# Patient Record
Sex: Male | Born: 1965 | Race: White | Hispanic: No | Marital: Single | Smoking: Current every day smoker
Health system: Southern US, Community
[De-identification: ages and names within clinical notes are randomized; demographics above are authoritative.]

## PROBLEM LIST (undated history)

## (undated) DIAGNOSIS — I10 Essential (primary) hypertension: Secondary | ICD-10-CM

## (undated) DIAGNOSIS — G629 Polyneuropathy, unspecified: Secondary | ICD-10-CM

## (undated) DIAGNOSIS — K56609 Unspecified intestinal obstruction, unspecified as to partial versus complete obstruction: Secondary | ICD-10-CM

## (undated) HISTORY — PX: HERNIA REPAIR: SHX51

## (undated) HISTORY — PX: ABDOMINAL SURGERY: SHX537

---

## 2015-02-14 ENCOUNTER — Emergency Department (HOSPITAL_BASED_OUTPATIENT_CLINIC_OR_DEPARTMENT_OTHER): Payer: Self-pay

## 2015-02-14 ENCOUNTER — Inpatient Hospital Stay (HOSPITAL_BASED_OUTPATIENT_CLINIC_OR_DEPARTMENT_OTHER)
Admission: EM | Admit: 2015-02-14 | Discharge: 2015-02-25 | DRG: 190 | Disposition: A | Discharge status: Home / Self Care | Payer: Self-pay | Attending: Family Medicine | Admitting: Family Medicine

## 2015-02-14 ENCOUNTER — Encounter (HOSPITAL_BASED_OUTPATIENT_CLINIC_OR_DEPARTMENT_OTHER): Payer: Self-pay | Admitting: Emergency Medicine

## 2015-02-14 ENCOUNTER — Emergency Department (HOSPITAL_BASED_OUTPATIENT_CLINIC_OR_DEPARTMENT_OTHER): Payer: MEDICAID

## 2015-02-14 DIAGNOSIS — D751 Secondary polycythemia: Secondary | ICD-10-CM | POA: Diagnosis present

## 2015-02-14 DIAGNOSIS — I420 Dilated cardiomyopathy: Secondary | ICD-10-CM | POA: Diagnosis present

## 2015-02-14 DIAGNOSIS — G629 Polyneuropathy, unspecified: Secondary | ICD-10-CM | POA: Diagnosis present

## 2015-02-14 DIAGNOSIS — J441 Chronic obstructive pulmonary disease with (acute) exacerbation: Principal | ICD-10-CM | POA: Diagnosis present

## 2015-02-14 DIAGNOSIS — J9621 Acute and chronic respiratory failure with hypoxia: Secondary | ICD-10-CM | POA: Diagnosis present

## 2015-02-14 DIAGNOSIS — J449 Chronic obstructive pulmonary disease, unspecified: Secondary | ICD-10-CM | POA: Diagnosis present

## 2015-02-14 DIAGNOSIS — Z79899 Other long term (current) drug therapy: Secondary | ICD-10-CM

## 2015-02-14 DIAGNOSIS — J984 Other disorders of lung: Secondary | ICD-10-CM | POA: Diagnosis present

## 2015-02-14 DIAGNOSIS — R109 Unspecified abdominal pain: Secondary | ICD-10-CM

## 2015-02-14 DIAGNOSIS — Z8249 Family history of ischemic heart disease and other diseases of the circulatory system: Secondary | ICD-10-CM

## 2015-02-14 DIAGNOSIS — Z825 Family history of asthma and other chronic lower respiratory diseases: Secondary | ICD-10-CM

## 2015-02-14 DIAGNOSIS — D7589 Other specified diseases of blood and blood-forming organs: Secondary | ICD-10-CM | POA: Diagnosis present

## 2015-02-14 DIAGNOSIS — J9690 Respiratory failure, unspecified, unspecified whether with hypoxia or hypercapnia: Secondary | ICD-10-CM

## 2015-02-14 DIAGNOSIS — J988 Other specified respiratory disorders: Secondary | ICD-10-CM | POA: Insufficient documentation

## 2015-02-14 DIAGNOSIS — F17200 Nicotine dependence, unspecified, uncomplicated: Secondary | ICD-10-CM | POA: Diagnosis present

## 2015-02-14 DIAGNOSIS — J9602 Acute respiratory failure with hypercapnia: Secondary | ICD-10-CM

## 2015-02-14 DIAGNOSIS — G4733 Obstructive sleep apnea (adult) (pediatric): Secondary | ICD-10-CM | POA: Diagnosis present

## 2015-02-14 DIAGNOSIS — J90 Pleural effusion, not elsewhere classified: Secondary | ICD-10-CM | POA: Diagnosis present

## 2015-02-14 DIAGNOSIS — Z720 Tobacco use: Secondary | ICD-10-CM | POA: Diagnosis present

## 2015-02-14 DIAGNOSIS — I10 Essential (primary) hypertension: Secondary | ICD-10-CM | POA: Diagnosis present

## 2015-02-14 DIAGNOSIS — J9600 Acute respiratory failure, unspecified whether with hypoxia or hypercapnia: Secondary | ICD-10-CM | POA: Diagnosis present

## 2015-02-14 DIAGNOSIS — D696 Thrombocytopenia, unspecified: Secondary | ICD-10-CM | POA: Diagnosis present

## 2015-02-14 DIAGNOSIS — J9622 Acute and chronic respiratory failure with hypercapnia: Secondary | ICD-10-CM | POA: Diagnosis present

## 2015-02-14 DIAGNOSIS — J189 Pneumonia, unspecified organism: Secondary | ICD-10-CM | POA: Diagnosis present

## 2015-02-14 DIAGNOSIS — R0902 Hypoxemia: Secondary | ICD-10-CM

## 2015-02-14 DIAGNOSIS — J9601 Acute respiratory failure with hypoxia: Secondary | ICD-10-CM | POA: Diagnosis present

## 2015-02-14 DIAGNOSIS — J9811 Atelectasis: Secondary | ICD-10-CM | POA: Diagnosis present

## 2015-02-14 DIAGNOSIS — E538 Deficiency of other specified B group vitamins: Secondary | ICD-10-CM | POA: Diagnosis present

## 2015-02-14 DIAGNOSIS — E669 Obesity, unspecified: Secondary | ICD-10-CM | POA: Diagnosis present

## 2015-02-14 DIAGNOSIS — J99 Respiratory disorders in diseases classified elsewhere: Secondary | ICD-10-CM | POA: Insufficient documentation

## 2015-02-14 DIAGNOSIS — J811 Chronic pulmonary edema: Secondary | ICD-10-CM

## 2015-02-14 DIAGNOSIS — Z6836 Body mass index (BMI) 36.0-36.9, adult: Secondary | ICD-10-CM

## 2015-02-14 HISTORY — DX: Polyneuropathy, unspecified: G62.9

## 2015-02-14 HISTORY — DX: Essential (primary) hypertension: I10

## 2015-02-14 HISTORY — DX: Unspecified intestinal obstruction, unspecified as to partial versus complete obstruction: K56.609

## 2015-02-14 LAB — LIPASE, BLOOD: LIPASE: 29 U/L (ref 11–51)

## 2015-02-14 LAB — CBC
HCT: 57.7 % — ABNORMAL HIGH (ref 39.0–52.0)
HEMOGLOBIN: 18 g/dL — AB (ref 13.0–17.0)
MCH: 31 pg (ref 26.0–34.0)
MCHC: 31.2 g/dL (ref 30.0–36.0)
MCV: 99.5 fL (ref 78.0–100.0)
Platelets: 136 10*3/uL — ABNORMAL LOW (ref 150–400)
RBC: 5.8 MIL/uL (ref 4.22–5.81)
RDW: 14.7 % (ref 11.5–15.5)
WBC: 5.5 10*3/uL (ref 4.0–10.5)

## 2015-02-14 LAB — COMPREHENSIVE METABOLIC PANEL
ALBUMIN: 3.4 g/dL — AB (ref 3.5–5.0)
ALK PHOS: 61 U/L (ref 38–126)
ALT: 32 U/L (ref 17–63)
ANION GAP: 5 (ref 5–15)
AST: 20 U/L (ref 15–41)
BILIRUBIN TOTAL: 0.7 mg/dL (ref 0.3–1.2)
BUN: 16 mg/dL (ref 6–20)
CALCIUM: 8.7 mg/dL — AB (ref 8.9–10.3)
CO2: 37 mmol/L — ABNORMAL HIGH (ref 22–32)
Chloride: 97 mmol/L — ABNORMAL LOW (ref 101–111)
Creatinine, Ser: 0.94 mg/dL (ref 0.61–1.24)
GFR calc Af Amer: >60 mL/min (ref >60)
GLUCOSE: 136 mg/dL — AB (ref 65–99)
POTASSIUM: 4.5 mmol/L (ref 3.5–5.1)
SODIUM: 139 mmol/L (ref 135–145)
TOTAL PROTEIN: 6.5 g/dL (ref 6.5–8.1)

## 2015-02-14 LAB — I-STAT ARTERIAL BLOOD GAS, ED
ACID-BASE EXCESS: 4 mmol/L — AB (ref 0.0–2.0)
BICARBONATE: 34 meq/L — AB (ref 20.0–24.0)
O2 Saturation: 85 %
PO2 ART: 58 mmHg — AB (ref 80.0–100.0)
Patient temperature: 98.6
TCO2: 36 mmol/L (ref 0–100)
pCO2 arterial: 70.7 mmHg (ref 35.0–45.0)
pH, Arterial: 7.29 — ABNORMAL LOW (ref 7.350–7.450)

## 2015-02-14 LAB — TROPONIN I

## 2015-02-14 LAB — D-DIMER, QUANTITATIVE: D-Dimer, Quant: 0.31 ug/mL-FEU (ref 0.00–0.50)

## 2015-02-14 LAB — BRAIN NATRIURETIC PEPTIDE: B Natriuretic Peptide: 28.9 pg/mL (ref 0.0–100.0)

## 2015-02-14 MED ORDER — CEFTRIAXONE SODIUM 1 G IJ SOLR
INTRAMUSCULAR | Status: AC
  Filled 2015-02-14: qty 10

## 2015-02-14 MED ORDER — IPRATROPIUM BROMIDE 0.02 % IN SOLN
0.5000 mg | Freq: Once | RESPIRATORY_TRACT | Status: AC
  Administered 2015-02-14: 0.5 mg via RESPIRATORY_TRACT
  Filled 2015-02-14: qty 2.5

## 2015-02-14 MED ORDER — IOHEXOL 350 MG/ML SOLN
100.0000 mL | Freq: Once | INTRAVENOUS | Status: AC | PRN
  Administered 2015-02-14: 100 mL via INTRAVENOUS

## 2015-02-14 MED ORDER — DEXTROSE 5 % IV SOLN
500.0000 mg | Freq: Once | INTRAVENOUS | Status: AC
  Administered 2015-02-14: 500 mg via INTRAVENOUS

## 2015-02-14 MED ORDER — LORAZEPAM 2 MG/ML IJ SOLN
1.0000 mg | Freq: Once | INTRAMUSCULAR | Status: AC
  Administered 2015-02-14: 1 mg via INTRAVENOUS
  Filled 2015-02-14: qty 1

## 2015-02-14 MED ORDER — AZITHROMYCIN 500 MG IV SOLR
INTRAVENOUS | Status: AC
  Filled 2015-02-14: qty 500

## 2015-02-14 MED ORDER — ALBUTEROL SULFATE (2.5 MG/3ML) 0.083% IN NEBU
5.0000 mg | INHALATION_SOLUTION | Freq: Once | RESPIRATORY_TRACT | Status: AC
  Administered 2015-02-14: 5 mg via RESPIRATORY_TRACT
  Filled 2015-02-14: qty 6

## 2015-02-14 MED ORDER — DEXTROSE 5 % IV SOLN
1.0000 g | Freq: Once | INTRAVENOUS | Status: AC
  Administered 2015-02-14: 1 g via INTRAVENOUS

## 2015-02-14 NOTE — ED Notes (Signed)
Patient resting.  Tolerating Bipap well

## 2015-02-14 NOTE — ED Notes (Signed)
MD at bedside. 

## 2015-02-14 NOTE — ED Notes (Signed)
Pt placed on BIPAP at 12/6 and 40% FI02 and full face mask. Pt tol well at this time.

## 2015-02-14 NOTE — ED Provider Notes (Signed)
CSN: [id number]     Arrival date & time 02/14/15  1738 History  By signing my name below, I, Murriel Hopper, attest that this documentation has been prepared under the direction and in the presence of Jerelyn Scott, MD. Electronically Signed: Murriel Hopper, ED Scribe. 02/14/2015. 6:03 PM.    Chief Complaint  Patient presents with  . Shortness of Breath   Patient is a 50 y.o. male presenting with shortness of breath. The history is provided by the patient. No language interpreter was used.  Shortness of Breath Severity:  Moderate Onset quality:  Gradual Duration:  3 days Timing:  Constant Progression:  Worsening Chronicity:  New Relieved by:  Rest Worsened by:  Activity, exertion and movement Ineffective treatments:  None tried Associated symptoms: no cough    HPI Comments: Bryan Gillespie is a 50 y.o. male who presents to the Emergency Department complaining of constant, worsening SOB that has been present for three days. Pt states that for the past three days he has felt some generalized weakness, and has had SOB, and states that today he woke up from a nap after not feeling well earlier in the day, and walked to his neighbors house. On the way to his neighbor's house he states he "felt like he was going to die" as he could barely walk and states he could barely breathe, so once he got back to his house he called EMS. Pt also reports he has had chronic intermittent swelling in his legs for two years, and reports that over the past couple of months he was diagnosed with bronchitis and the flu, and states he has not been feeling well in general over th past month. Pt states that his voice has changed recently as well, as he states it is deeper than it normally is. Pt also states he has been admitted twice to [facility].    Past Medical History  Diagnosis Date  . Hypertension   . Bowel obstruction (HCC)   . Peripheral neuropathy [facility])    Past Surgical History  Procedure  Laterality Date  . Abdominal surgery    . Hernia repair     Family History  Problem Relation Age of Onset  . Diabetes Mother   . Hypertension Other    Social History  Substance Use Topics  . Smoking status: Current Every Day Smoker  . Smokeless tobacco: None  . Alcohol Use: No    Review of Systems  Respiratory: Positive for shortness of breath. Negative for cough.     A complete 10 system review of systems was obtained and all systems are negative except as noted in the HPI and PMH.    Allergies  Review of patient's allergies indicates no known allergies.  Home Medications   Prior to Admission medications   Medication Sig Start Date End Date Taking? Authorizing Provider  amLODipine (NORVASC) 10 MG tablet Take 10 mg by mouth daily.   Yes Historical Provider, MD   BP 112/78 mmHg  Pulse 98  Temp(Src) 98.4 F (36.9 C) (Oral)  Resp 19  Ht 5\' 11"  (1.803 m)  Wt 120.1 kg  BMI 36.94 kg/m2  SpO2 93%  Vitals reviewed Physical Exam  Physical Examination: General appearance - alert, ill appearing, and in distress Mental status - alert, oriented to person, place, and time Eyes - no conjunctival injection no scleral icterus Mouth - mucous membranes moist, pharynx normal without lesions Chest - dimnished breath sounds bilaterally, BSS, no wheezing , no crackles,  no stridor or rhonchi Heart - normal rate, regular rhythm, normal S1, S2, no murmurs, rubs, clicks or gallops Abdomen - soft, nontender, nondistended, no masses or organomegaly Neurological - alert, oriented, normal speech, no focal findings or movement disorder noted Extremities - peripheral pulses normal, bilateral 2+ edema, no clubbing or cyanosis Skin - normal coloration and turgor, no rashes  ED Course  Procedures (including critical care time)  DIAGNOSTIC STUDIES: Oxygen Saturation is 92% on [place], low by my interpretation.    COORDINATION OF CARE: 6:01 PM Discussed treatment plan with pt at bedside and pt  agreed to plan.   Labs Review Labs Reviewed  CBC - Abnormal; Notable for the following:    Hemoglobin 18.0 (*)    HCT 57.7 (*)    Platelets 136 (*)    All other components within normal limits  COMPREHENSIVE METABOLIC PANEL - Abnormal; Notable for the following:    Chloride 97 (*)    CO2 37 (*)    Glucose, Bld 136 (*)    Calcium 8.7 (*)    Albumin 3.4 (*)    All other components within normal limits  URINE RAPID DRUG SCREEN, HOSP PERFORMED - Abnormal; Notable for the following:    Tetrahydrocannabinol POSITIVE (*)    All other components within normal limits  COMPREHENSIVE METABOLIC PANEL - Abnormal; Notable for the following:    Chloride 98 (*)    CO2 35 (*)    Glucose, Bld 117 (*)    Albumin 3.4 (*)    Total Bilirubin 1.3 (*)    All other components within normal limits  CBC - Abnormal; Notable for the following:    RBC 5.83 (*)    Hemoglobin 18.6 (*)    HCT 59.4 (*)    MCV 101.9 (*)    Platelets 135 (*)    All other components within normal limits  HEMOGLOBIN A1C - Abnormal; Notable for the following:    Hgb A1c MFr Bld 5.9 (*)    All other components within normal limits  LIPID PANEL - Abnormal; Notable for the following:    HDL 29 (*)    LDL Cholesterol 123 (*)    All other components within normal limits  BLOOD GAS, ARTERIAL - Abnormal; Notable for the following:    pH, Arterial 7.286 (*)    pCO2 arterial 78.5 (*)    pO2, Arterial 62.6 (*)    Bicarbonate 36.2 (*)    Acid-Base Excess 9.6 (*)    All other components within normal limits  BLOOD GAS, ARTERIAL - Abnormal; Notable for the following:    pH, Arterial 7.319 (*)    pCO2 arterial 70.3 (*)    pO2, Arterial 72.0 (*)    Bicarbonate 35.1 (*)    Acid-Base Excess 9.0 (*)    All other components within normal limits  BLOOD GAS, ARTERIAL - Abnormal; Notable for the following:    pCO2 arterial 59.3 (*)    pO2, Arterial 63.3 (*)    Bicarbonate 33.6 (*)    Acid-Base Excess 8.3 (*)    All other components  within normal limits  COMPREHENSIVE METABOLIC PANEL - Abnormal; Notable for the following:    Chloride 96 (*)    CO2 37 (*)    Total Protein 6.0 (*)    Albumin 3.1 (*)    All other components within normal limits  CBC WITH DIFFERENTIAL/PLATELET - Abnormal; Notable for the following:    WBC 11.8 (*)    Hemoglobin 17.9 (*)  HCT 56.8 (*)    MCV 101.8 (*)    Platelets 118 (*)    Neutro Abs 9.9 (*)    All other components within normal limits  I-STAT ARTERIAL BLOOD GAS, ED - Abnormal; Notable for the following:    pH, Arterial 7.290 (*)    pCO2 arterial 70.7 (*)    pO2, Arterial 58.0 (*)    Bicarbonate 34.0 (*)    Acid-Base Excess 4.0 (*)    All other components within normal limits  CULTURE, BLOOD (ROUTINE X 2)  CULTURE, BLOOD (ROUTINE X 2)  MRSA PCR SCREENING  CULTURE, EXPECTORATED SPUTUM-ASSESSMENT  GRAM STAIN  RESPIRATORY VIRUS PANEL  CULTURE, EXPECTORATED SPUTUM-ASSESSMENT  LIPASE, BLOOD  D-DIMER, QUANTITATIVE (NOT AT [facility])  TROPONIN I  BRAIN NATRIURETIC PEPTIDE  TROPONIN I  TROPONIN I  TROPONIN I  URINALYSIS, ROUTINE W REFLEX MICROSCOPIC (NOT AT [facility])  MAGNESIUM  PHOSPHORUS  TSH  INFLUENZA PANEL BY PCR (TYPE A & B, H1N1)  LEGIONELLA ANTIGEN, URINE  PROTIME-INR  STREP PNEUMONIAE URINARY ANTIGEN  MAGNESIUM   CRITICAL CARE Performed by: Ethelda Chick Total critical care time: 60 minutes Critical care time was exclusive of separately billable procedures and treating other patients. Critical care was necessary to treat or prevent imminent or life-threatening deterioration. Critical care was time spent personally by me on the following activities: development of treatment plan with patient and/or surrogate as well as nursing, discussions with consultants, evaluation of patient's response to treatment, examination of patient, obtaining history from patient or surrogate, ordering and performing treatments and interventions, ordering and review of laboratory studies,  ordering and review of radiographic studies, pulse oximetry and re-evaluation of patient's condition. Imaging Review Dg Chest Port 1 View  02/17/2015  CLINICAL DATA:  Respiratory failure EXAM: PORTABLE CHEST 1 VIEW COMPARISON:  02/14/2015 FINDINGS: Lungs are markedly under aerated. Bibasilar airspace disease has increased. No pneumothorax. Cardiac silhouette is prominent likely due to low volumes. IMPRESSION: Increased bibasilar airspace disease. Electronically Signed   By: Jolaine Click M.D.   On: 02/17/2015 07:45   I have personally reviewed and evaluated these images and lab results as part of my medical decision-making.   EKG Interpretation   Date/Time:  Monday February 14 2015 18:16:30 EST Ventricular Rate:  92 PR Interval:  167 QRS Duration: 104 QT Interval:  341 QTC Calculation: 422 R Axis:   -173 Text Interpretation:  Sinus rhythm Anterior infarct, old No old tracing to  compare Confirmed by [facility]  MD, Amirr Achord [phone]) on 02/14/2015 7:36:11 PM      MDM   Final diagnoses:  Abdominal pain  shortness of breath hypercapnea Respiratory failure  Pt presenting with c/o shortness of breath, leg swelling.  He states the leg swelling has been ongoing for 2 years- but today he had acute onset of shortness of breath.  He has not been feeling well with viral illnesses for the past couple of weeks, but SOB acutely worsened today.  CXR reassuring, labs reassuring but pt is hypoxic.  No wheezing on exam but BS diminished.  Pt is a smoker.  CT angio negative for PE- obtained despite negative d-dimer due to ongoing unexplained hypoxia and shortness of breath.  Pt did not improved after duonebs- presuming underlying COPD- ABG showed hypercapnia- pt started on bipap.  Admitted to stepdown- d/w hospitalist and with PCCM who feels he is stable for stepdown bed.     10:05 PM d/w Dr. Toniann Fail for admission to stepdown.  He requests d/w ICU- d/w Dr.  Manum PCCM- he states pt is ok for stepdown bed- will  admit to hospitalist service to step down bed.    I personally performed the services described in this documentation, which was scribed in my presence. The recorded information has been reviewed and is accurate.    Jerelyn Scott, MD 02/17/15 [phone]

## 2015-02-14 NOTE — Plan of Care (Signed)
50 year old male with no significant past medical history presented to the ER because of acute shortness of breath. As per Dr. Karma Ganja ED physician patient was hypoxic and ABG shows hypercarbia and had to be placed on BiPAP. CT angiogram was negative for PE. Patient was given nebulizer treatment despite with patient is still short of breath. Pulmonary critical care has been consulted and patient will be admitted to stepdown unit.  Bryan Gillespie.

## 2015-02-14 NOTE — ED Notes (Addendum)
Per ems: the patient reports that he has new onset quick swelling to his legs and lips with difficulty breathing. The patient has some abdominal discomfort because his Abdominal area is "tight" - LBM 1 hour prior to ems arrival. Low RA sats with EMS. Productive cough x 2 weeks

## 2015-02-15 ENCOUNTER — Inpatient Hospital Stay (HOSPITAL_COMMUNITY): Payer: Self-pay

## 2015-02-15 ENCOUNTER — Encounter (HOSPITAL_COMMUNITY): Payer: Self-pay | Admitting: Internal Medicine

## 2015-02-15 DIAGNOSIS — R0600 Dyspnea, unspecified: Secondary | ICD-10-CM

## 2015-02-15 DIAGNOSIS — Z720 Tobacco use: Secondary | ICD-10-CM | POA: Diagnosis present

## 2015-02-15 DIAGNOSIS — I10 Essential (primary) hypertension: Secondary | ICD-10-CM | POA: Diagnosis present

## 2015-02-15 DIAGNOSIS — J9602 Acute respiratory failure with hypercapnia: Secondary | ICD-10-CM

## 2015-02-15 DIAGNOSIS — J9600 Acute respiratory failure, unspecified whether with hypoxia or hypercapnia: Secondary | ICD-10-CM | POA: Diagnosis present

## 2015-02-15 DIAGNOSIS — J9601 Acute respiratory failure with hypoxia: Secondary | ICD-10-CM

## 2015-02-15 LAB — BLOOD GAS, ARTERIAL
Acid-Base Excess: 9 mmol/L — ABNORMAL HIGH (ref 0.0–2.0)
BICARBONATE: 35.1 meq/L — AB (ref 20.0–24.0)
DELIVERY SYSTEMS: POSITIVE
DRAWN BY: 331471
Expiratory PAP: 10
FIO2: 0.7
Inspiratory PAP: 15
MODE: POSITIVE
O2 Saturation: 92.6 %
Patient temperature: 98.6
TCO2: 37.3 mmol/L (ref 0–100)
pCO2 arterial: 70.3 mmHg (ref 35.0–45.0)
pH, Arterial: 7.319 — ABNORMAL LOW (ref 7.350–7.450)
pO2, Arterial: 72 mmHg — ABNORMAL LOW (ref 80.0–100.0)

## 2015-02-15 LAB — COMPREHENSIVE METABOLIC PANEL
ALBUMIN: 3.4 g/dL — AB (ref 3.5–5.0)
ALK PHOS: 71 U/L (ref 38–126)
ALT: 32 U/L (ref 17–63)
AST: 19 U/L (ref 15–41)
Anion gap: 6 (ref 5–15)
BUN: 9 mg/dL (ref 6–20)
CO2: 35 mmol/L — ABNORMAL HIGH (ref 22–32)
Calcium: 8.9 mg/dL (ref 8.9–10.3)
Chloride: 98 mmol/L — ABNORMAL LOW (ref 101–111)
Creatinine, Ser: 0.93 mg/dL (ref 0.61–1.24)
GFR calc Af Amer: >60 mL/min (ref >60)
GFR calc non Af Amer: >60 mL/min (ref >60)
GLUCOSE: 117 mg/dL — AB (ref 65–99)
POTASSIUM: 5.1 mmol/L (ref 3.5–5.1)
SODIUM: 139 mmol/L (ref 135–145)
TOTAL PROTEIN: 6.7 g/dL (ref 6.5–8.1)
Total Bilirubin: 1.3 mg/dL — ABNORMAL HIGH (ref 0.3–1.2)

## 2015-02-15 LAB — LIPID PANEL
CHOL/HDL RATIO: 5.7 ratio
CHOLESTEROL: 165 mg/dL (ref 0–200)
HDL: 29 mg/dL — ABNORMAL LOW (ref >40)
LDL Cholesterol: 123 mg/dL — ABNORMAL HIGH (ref 0–99)
Triglycerides: 64 mg/dL (ref <150)
VLDL: 13 mg/dL (ref 0–40)

## 2015-02-15 LAB — CBC
HEMATOCRIT: 59.4 % — AB (ref 39.0–52.0)
HEMOGLOBIN: 18.6 g/dL — AB (ref 13.0–17.0)
MCH: 31.9 pg (ref 26.0–34.0)
MCHC: 31.3 g/dL (ref 30.0–36.0)
MCV: 101.9 fL — ABNORMAL HIGH (ref 78.0–100.0)
Platelets: 135 10*3/uL — ABNORMAL LOW (ref 150–400)
RBC: 5.83 MIL/uL — AB (ref 4.22–5.81)
RDW: 15 % (ref 11.5–15.5)
WBC: 6.5 10*3/uL (ref 4.0–10.5)

## 2015-02-15 LAB — URINALYSIS, ROUTINE W REFLEX MICROSCOPIC
BILIRUBIN URINE: NEGATIVE
Glucose, UA: NEGATIVE mg/dL
Hgb urine dipstick: NEGATIVE
Ketones, ur: NEGATIVE mg/dL
LEUKOCYTES UA: NEGATIVE
NITRITE: NEGATIVE
PH: 5 (ref 5.0–8.0)
Protein, ur: NEGATIVE mg/dL
SPECIFIC GRAVITY, URINE: 1.015 (ref 1.005–1.030)

## 2015-02-15 LAB — TROPONIN I
Troponin I: <0.03 ng/mL (ref <0.031)
Troponin I: <0.03 ng/mL (ref <0.031)
Troponin I: <0.03 ng/mL (ref <0.031)

## 2015-02-15 LAB — RAPID URINE DRUG SCREEN, HOSP PERFORMED
AMPHETAMINES: NOT DETECTED
BARBITURATES: NOT DETECTED
BENZODIAZEPINES: NOT DETECTED
COCAINE: NOT DETECTED
OPIATES: NOT DETECTED
TETRAHYDROCANNABINOL: POSITIVE — AB

## 2015-02-15 LAB — MAGNESIUM: Magnesium: 2 mg/dL (ref 1.7–2.4)

## 2015-02-15 LAB — INFLUENZA PANEL BY PCR (TYPE A & B)
H1N1FLUPCR: NOT DETECTED
INFLBPCR: NEGATIVE
Influenza A By PCR: NEGATIVE

## 2015-02-15 LAB — MRSA PCR SCREENING: MRSA by PCR: NEGATIVE

## 2015-02-15 LAB — PROTIME-INR
INR: 1.18 (ref 0.00–1.49)
PROTHROMBIN TIME: 15.2 s (ref 11.6–15.2)

## 2015-02-15 LAB — STREP PNEUMONIAE URINARY ANTIGEN: Strep Pneumo Urinary Antigen: NEGATIVE

## 2015-02-15 LAB — TSH: TSH: 0.65 u[IU]/mL (ref 0.350–4.500)

## 2015-02-15 LAB — PHOSPHORUS: Phosphorus: 4.4 mg/dL (ref 2.5–4.6)

## 2015-02-15 MED ORDER — ACETAMINOPHEN 650 MG RE SUPP: 650.0000 mg | Freq: Four times a day (QID) | RECTAL | Status: DC | PRN

## 2015-02-15 MED ORDER — DEXTROSE 5 % IV SOLN
500.0000 mg | INTRAVENOUS | Status: DC
  Administered 2015-02-15: 500 mg via INTRAVENOUS
  Filled 2015-02-15 (×2): qty 500

## 2015-02-15 MED ORDER — MORPHINE SULFATE (PF) 2 MG/ML IV SOLN
2.0000 mg | INTRAVENOUS | Status: DC | PRN
  Administered 2015-02-15: 2 mg via INTRAVENOUS
  Filled 2015-02-15: qty 1

## 2015-02-15 MED ORDER — ACETAMINOPHEN 325 MG PO TABS
650.0000 mg | ORAL_TABLET | Freq: Four times a day (QID) | ORAL | Status: DC | PRN
  Administered 2015-02-19: 650 mg via ORAL
  Filled 2015-02-15: qty 2

## 2015-02-15 MED ORDER — LORAZEPAM 2 MG/ML IJ SOLN: 1.0000 mg | Freq: Once | INTRAMUSCULAR | Status: DC | PRN

## 2015-02-15 MED ORDER — ASPIRIN EC 81 MG PO TBEC
81.0000 mg | DELAYED_RELEASE_TABLET | Freq: Every day | ORAL | Status: DC
  Administered 2015-02-15 – 2015-02-25 (×11): 81 mg via ORAL
  Filled 2015-02-15 (×11): qty 1

## 2015-02-15 MED ORDER — ARFORMOTEROL TARTRATE 15 MCG/2ML IN NEBU
15.0000 ug | INHALATION_SOLUTION | Freq: Two times a day (BID) | RESPIRATORY_TRACT | Status: DC
  Administered 2015-02-16: 15 ug via RESPIRATORY_TRACT
  Filled 2015-02-15 (×6): qty 2

## 2015-02-15 MED ORDER — WHITE PETROLATUM GEL
Status: AC
  Filled 2015-02-15: qty 1

## 2015-02-15 MED ORDER — GUAIFENESIN ER 600 MG PO TB12
600.0000 mg | ORAL_TABLET | Freq: Two times a day (BID) | ORAL | Status: DC
  Administered 2015-02-15: 600 mg via ORAL
  Filled 2015-02-15: qty 1

## 2015-02-15 MED ORDER — METHYLPREDNISOLONE SODIUM SUCC 40 MG IJ SOLR
40.0000 mg | Freq: Four times a day (QID) | INTRAMUSCULAR | Status: DC
  Administered 2015-02-15 – 2015-02-16 (×6): 40 mg via INTRAVENOUS
  Filled 2015-02-15 (×6): qty 1

## 2015-02-15 MED ORDER — ENOXAPARIN SODIUM 40 MG/0.4ML ~~LOC~~ SOLN
40.0000 mg | Freq: Every day | SUBCUTANEOUS | Status: DC
Start: 2015-02-15 — End: 2015-02-25
  Administered 2015-02-15 – 2015-02-25 (×11): 40 mg via SUBCUTANEOUS
  Filled 2015-02-15 (×11): qty 0.4

## 2015-02-15 MED ORDER — IPRATROPIUM-ALBUTEROL 0.5-2.5 (3) MG/3ML IN SOLN
3.0000 mL | Freq: Four times a day (QID) | RESPIRATORY_TRACT | Status: DC
  Administered 2015-02-15 – 2015-02-16 (×5): 3 mL via RESPIRATORY_TRACT
  Filled 2015-02-15 (×5): qty 3

## 2015-02-15 MED ORDER — ENOXAPARIN SODIUM 40 MG/0.4ML ~~LOC~~ SOLN: 40.0000 mg | SUBCUTANEOUS | Status: DC

## 2015-02-15 MED ORDER — DM-GUAIFENESIN ER 30-600 MG PO TB12
1.0000 | ORAL_TABLET | Freq: Two times a day (BID) | ORAL | Status: DC
  Administered 2015-02-15 – 2015-02-23 (×17): 1 via ORAL
  Filled 2015-02-15 (×17): qty 1

## 2015-02-15 MED ORDER — DEXTROSE 5 % IV SOLN
1.0000 g | INTRAVENOUS | Status: DC
  Administered 2015-02-15 – 2015-02-17 (×3): 1 g via INTRAVENOUS
  Filled 2015-02-15 (×4): qty 10

## 2015-02-15 MED ORDER — IPRATROPIUM BROMIDE 0.02 % IN SOLN: 0.5000 mg | Freq: Four times a day (QID) | RESPIRATORY_TRACT | Status: DC

## 2015-02-15 MED ORDER — ONDANSETRON HCL 4 MG PO TABS: 4.0000 mg | ORAL_TABLET | Freq: Four times a day (QID) | ORAL | Status: DC | PRN

## 2015-02-15 MED ORDER — ONDANSETRON HCL 4 MG/2ML IJ SOLN: 4.0000 mg | Freq: Four times a day (QID) | INTRAMUSCULAR | Status: DC | PRN

## 2015-02-15 MED ORDER — SODIUM CHLORIDE 0.9 % IJ SOLN
3.0000 mL | Freq: Two times a day (BID) | INTRAMUSCULAR | Status: DC
  Administered 2015-02-15 – 2015-02-25 (×18): 3 mL via INTRAVENOUS

## 2015-02-15 MED ORDER — ALBUTEROL SULFATE (2.5 MG/3ML) 0.083% IN NEBU
5.0000 mg | INHALATION_SOLUTION | RESPIRATORY_TRACT | Status: DC | PRN
  Administered 2015-02-15: 5 mg via RESPIRATORY_TRACT
  Filled 2015-02-15: qty 6

## 2015-02-15 MED ORDER — NICOTINE 21 MG/24HR TD PT24
21.0000 mg | MEDICATED_PATCH | Freq: Every day | TRANSDERMAL | Status: DC
  Administered 2015-02-15 – 2015-02-25 (×11): 21 mg via TRANSDERMAL
  Filled 2015-02-15 (×11): qty 1

## 2015-02-15 NOTE — Progress Notes (Signed)
[facility] TEAM 1 - Stepdown/ICU TEAM Progress Note  Bryan Gillespie ZOX:[id number] DOB: December 28, 1965 DOA: 02/14/2015 PCP: Pcp Not In System  Admit HPI / Brief Narrative: Bryan Gillespie is a 49 y.o. WM PMHx Hypertension; Bowel obstruction (HCC); and Peripheral neuropathy (HCC), Tobacco Abuse.   Presented with worsening shortness of breath over the past 3 days associated generalized weakness He reports today he try to walk over to his neighbor's house and developed acute worsening shortness of breath he was" felt like he is going to die" so he returned and called EMS. He reports that he has presented similar symptoms to outpatient pruritus and was diagnosed with bronchitis and flu. Patient states that he had to be intubated 2 years ago. There is no records in the system regarding this  In emergency department was found to have ABG 7.290/CO2 70.7 and PO2 of 58.0 was transiently on BiPAP Troponin was normal hemoglobin noted to be elevated at 18 PCO2 of 37 CTA of the chest did not show any evidence of PE some evidence of atelectasis but less likely pneumonia  Was discussed with [facility] at this point felt the patient was stable for admission to stepdown  Hospitalist was called for admission for acute respiratory failure  HPI/Subjective: 1/24  Patient alert on BiPAP  Assessment/Plan: Acute respiratory failure with hypoxia and hypercarbia (HCC)/COPD exacerbation  -it is unclear if patient may have some CO2 retention chronically. He denies being on oxygen likely this is an acute change.  -Most likely multifactorial to include COPD Exacerbation /CAPS, CHF? -Continue empiric antibiotics -DuoNeb QID -Sodium Medrol 40 mg QID -Brovana BID -Morphine 2 mg PRN air hunger -Flutter valve when patient able to tolerate being off of BiPAP -Mucinex DM - Essential hypertension  -Currently BP soft hold all BP medication  -Given lower extremity edema and dyspnea will obtain echogram    Tobacco abuse  -  Continue Nicotine Patch. When patient closer to discharge a frank discussion needs to be had with patient, concerning he may die if he continues to smoke. Would also address changing patient to DO NOT RESUSCITATE at that time    Code Status: FULL Family Communication: no family present at time of exam Disposition Plan: SNF?    Consultants: Rudi Heap [facility]   Procedure/Significant Events: 1/23 CT angiogram PE protocol;- negative PE.-Bibasilar peribronchovascular linear opacities in dependent distribution, likely subsegmental atelectasis.Pneumonia although possible is felt less likely.   Culture 1/24 sputum culture pending 1/24 influenza panel pending 1/24 virus panel pending 1/24 Legionella urine antigen pending 1/24 strep pneumo urine antigen pending   Antibiotics: Azithromycin 1/24>> Ceftriaxone 1/24>>   DVT prophylaxis: Lovenox   Devices    LINES / TUBES:      Continuous Infusions:   Objective: VITAL SIGNS: Temp: 100.2 F (37.9 C) (01/24 0745) Temp Source: Axillary (01/24 0745) BP: 135/90 mmHg (01/24 0814) Pulse Rate: 87 (01/24 1100) SPO2; FIO2:   Intake/Output Summary (Last 24 hours) at 02/15/15 1130 Last data filed at 02/15/15 1100  Gross per 24 hour  Intake      3 ml  Output    900 ml  Net   -897 ml     Exam: General: Alert on BiPAP, positive Acute Respiratory Distress Eyes: Negative headache,negative scleral hemorrhage ENT: Negative Runny nose, negative gingival bleeding, Neck:  Negative scars, masses, torticollis, lymphadenopathy, JVD Lungs: diffuse poor air movement all lung fields, positive expiratory wheezes, negative crackles Cardiovascular: Tachycardic, Regular rhythm without murmur gallop or rub normal S1 and  S2 Abdomen:negative abdominal pain, nondistended, positive soft, bowel sounds, no rebound, no ascites, no appreciable mass Extremities: No significant cyanosis, clubbing, or edema bilateral lower  extremities Psychiatric:  Negative depression, negative anxiety, negative fatigue, negative mania  Neurologic:  Cranial nerves II through XII intact, tongue/uvula midline, all extremities muscle strength 5/5, sensation intact throughout,  negative dysarthria, negative expressive aphasia, negative receptive aphasia.   Data Reviewed: Basic Metabolic Panel:  Recent Labs Lab 02/14/15 1800 02/15/15 0902  NA 139 139  K 4.5 5.1  CL 97* 98*  CO2 37* 35*  GLUCOSE 136* 117*  BUN 16 9  CREATININE 0.94 0.93  CALCIUM 8.7* 8.9  MG  --  2.0  PHOS  --  4.4   Liver Function Tests:  Recent Labs Lab 02/14/15 1800 02/15/15 0902  AST 20 19  ALT 32 32  ALKPHOS 61 71  BILITOT 0.7 1.3*  PROT 6.5 6.7  ALBUMIN 3.4* 3.4*    Recent Labs Lab 02/14/15 1800  LIPASE 29   No results for input(s): AMMONIA in the last 168 hours. CBC:  Recent Labs Lab 02/14/15 1800 02/15/15 0902  WBC 5.5 6.5  HGB 18.0* 18.6*  HCT 57.7* 59.4*  MCV 99.5 101.9*  PLT 136* 135*   Cardiac Enzymes:  Recent Labs Lab 02/14/15 1800 02/15/15 0248 02/15/15 0902  TROPONINI <0.03 <0.03 <0.03   BNP (last 3 results)  Recent Labs  02/14/15 1800  BNP 28.9    ProBNP (last 3 results) No results for input(s): PROBNP in the last 8760 hours.  CBG: No results for input(s): GLUCAP in the last 168 hours.  No results found for this or any previous visit (from the past 240 hour(s)).   Studies:  Recent x-ray studies have been reviewed in detail by the Attending Physician  Scheduled Meds:  Scheduled Meds: . aspirin EC  81 mg Oral Daily  . azithromycin  500 mg Intravenous Q24H  . cefTRIAXone (ROCEPHIN)  IV  1 g Intravenous Q24H  . enoxaparin (LOVENOX) injection  40 mg Subcutaneous Daily  . guaiFENesin  600 mg Oral BID  . ipratropium-albuterol  3 mL Nebulization Q6H  . methylPREDNISolone (SOLU-MEDROL) injection  40 mg Intravenous 4 times per day  . nicotine  21 mg Transdermal Daily  . sodium chloride  3 mL  Intravenous Q12H  . white petrolatum        Time spent on care of this patient: 40 mins   Bryan Gillespie, Roselind Messier , MD  [facility] Office  [phone] Pager - [phone]  On-Call/Text Page:      [email or web address]      password [email or web address]  If 7PM-7AM, please contact night-coverage [email or web address] Password [email or web address] 02/15/2015, 11:30 AM   LOS: 1 day   Care during the described time interval was provided by me .  I have reviewed this patient's available data, including medical history, events of note, physical examination, and all test results as part of my evaluation. I have personally reviewed and interpreted all radiology studies.   Carolyne Littles, MD [phone] Pager

## 2015-02-15 NOTE — Progress Notes (Signed)
eLink Physician-Brief Progress Note Patient Name: Bryan Gillespie DOB: [birth year] MRN: [id number]   Date of Service  02/15/2015  HPI/Events of Note  Notified of need for DVT Prophylaxis. Hgb = 18.0, Platelets = 136K and Creatinine = 0.94.  eICU Interventions  Will order:  1. Lovenox 40 mg [place] now and Q day.     Intervention Category Intermediate Interventions: Best-practice therapies (e.g. DVT, beta blocker, etc.)  Shary Lamos [place] 02/15/2015, 1:42 AM

## 2015-02-15 NOTE — Progress Notes (Signed)
ABG results given to Dr. Joseph Art. Rt placed pt on 15LPM HFNC sats 88%. PT has very dim bs.Rt gave a PRN neb  of Albuterol. No distress noted at this time.

## 2015-02-15 NOTE — Progress Notes (Signed)
Rt gave pt his flutter valve . Pt knows and understands how to use.  

## 2015-02-15 NOTE — H&P (Signed)
PCP:  Pcp Not In System    Referring provider transfer from [facility]   Chief Complaint:   Shortness of breath  HPI: Bryan Gillespie is a 50 y.o. male   has a past medical history of Hypertension; Bowel obstruction (HCC); and Peripheral neuropathy (HCC).   Presented with worsening shortness of breath over the past 3 days associated generalized weakness He reports today he try to walk over to his neighbor's house and developed acute worsening shortness of breath he was" felt like he is going to die" so he returned and called EMS. He reports that he has presented similar symptoms to outpatient pruritus and was diagnosed with bronchitis and flu. Patient states that he had to be intubated 2 years ago. There is no records in the system regarding this  In emergency department was found to have ABG 7.290/CO2 70.7 and PO2 of 58.0 was transiently on BiPAP Troponin was normal hemoglobin noted to be elevated at 18 PCO2 of 37 CTA of the chest did not show any evidence of PE some evidence of atelectasis but less likely pneumonia  Was discussed with [facility] M at this point felt  the patient was stable for admission to stepdown  Hospitalist was called for admission for acute respiratory failure  Review of Systems:    Pertinent positives include: shortness of breath at rest. dyspnea on exertion  Constitutional:  No weight loss, night sweats, Fevers, chills, fatigue, weight loss  HEENT:  No headaches, Difficulty swallowing,Tooth/dental problems,Sore throat,  No sneezing, itching, ear ache, nasal congestion, post nasal drip,  Cardio-vascular:  No chest pain, Orthopnea, PND, anasarca, dizziness, palpitations.no Bilateral lower extremity swelling  GI:  No heartburn, indigestion, abdominal pain, nausea, vomiting, diarrhea, change in bowel habits, loss of appetite, melena, blood in stool, hematemesis Resp:   , No excess mucus, no productive cough, No non-productive cough, No coughing up of  blood.No change in color of mucus.No wheezing. Skin:  no rash or lesions. No jaundice GU:  no dysuria, change in color of urine, no urgency or frequency. No straining to urinate.  No flank pain.  Musculoskeletal:  No joint pain or no joint swelling. No decreased range of motion. No back pain.  Psych:  No change in mood or affect. No depression or anxiety. No memory loss.  Neuro: no localizing neurological complaints, no tingling, no weakness, no double vision, no gait abnormality, no slurred speech, no confusion  Otherwise ROS are negative except for above, 10 systems were reviewed  Past Medical History: Past Medical History  Diagnosis Date  . Hypertension   . Bowel obstruction (HCC)   . Peripheral neuropathy [facility])    Past Surgical History  Procedure Laterality Date  . Abdominal surgery    . Hernia repair       Medications: Prior to Admission medications   Medication Sig Start Date End Date Taking? Authorizing Provider  amLODipine (NORVASC) 10 MG tablet Take 10 mg by mouth daily.   Yes Historical Provider, MD    Allergies:  No Known Allergies  Social History:  Ambulatory  independently   Lives at home alone,         reports that he has been smoking.  He does not have any smokeless tobacco history on file. He reports that he does not drink alcohol or use illicit drugs.    Family History: family history includes Diabetes in his mother; Hypertension in his other.    Physical Exam: Patient Vitals for the past 24  hrs:  BP Temp Temp src Pulse Resp SpO2 Height Weight  02/15/15 0044 (!) 129/92 mmHg - - 96 - -  (1.803 m) 120.1 kg (264 lb 12.4 oz)  02/14/15 2300 (!) 134/106 mmHg - - 89 19 95 % - -  02/14/15 2230 128/95 mmHg - - 94 18 (!) 86 % - -  02/14/15 2200 124/89 mmHg - - 95 18 92 % - -  02/14/15 2148 - - - - - - - 117.935 kg (260 lb)  02/14/15 2100 (!) 142/107 mmHg - - 93 15 97 % - -  02/14/15 2057 119/88 mmHg - - 95 25 91 % - -  02/14/15 2036 119/88 mmHg  - - 94 20 95 % - -  02/14/15 2000 115/83 mmHg - - 92 20 94 % - -  02/14/15 1931 126/88 mmHg 98.7 F (37.1 C) Oral 93 20 90 % - -  02/14/15 1857 - - - - - 91 % - -  02/14/15 1822 133/100 mmHg - - 95 18 92 % - -  02/14/15 1745 123/81 mmHg 98.9 F (37.2 C) Oral 94 24 92 % - -    1. General:  in No Acute distress 2. Psychological: Alert and   Oriented 3. Head/ENT:    Dry Mucous Membranes                          Head Non traumatic, neck supple                            Poor Dentition 4. SKIN:   decreased Skin turgor,  Skin clean Dry and intact no rash 5. Heart: Regular rate and rhythm no Murmur, Rub or gallop 6. Lungs some  Mild crackles  BIPAP air sounds 7. Abdomen: Soft, non-tender, Non distended 8. Lower extremities: no clubbing, cyanosis, or edema 9. Neurologically Grossly intact, moving all 4 extremities equally 10. MSK: Normal range of motion  body mass index is 36.94 kg/(m^2).   Labs on Admission:   Results for orders placed or performed during the hospital encounter of 02/14/15 (from the past 24 hour(s))  CBC     Status: Abnormal   Collection Time: 02/14/15  6:00 PM  Result Value Ref Range   WBC 5.5 4.0 - 10.5 K/uL   RBC 5.80 4.22 - 5.81 MIL/uL   Hemoglobin 18.0 (H) 13.0 - 17.0 g/dL   HCT 16.1 (H) 09.6 - 04.5 %   MCV 99.5 78.0 - 100.0 fL   MCH 31.0 26.0 - 34.0 pg   MCHC 31.2 30.0 - 36.0 g/dL   RDW 40.9 81.1 - 91.4 %   Platelets 136 (L) 150 - 400 K/uL  Comprehensive metabolic panel     Status: Abnormal   Collection Time: 02/14/15  6:00 PM  Result Value Ref Range   Sodium 139 135 - 145 mmol/L   Potassium 4.5 3.5 - 5.1 mmol/L   Chloride 97 (L) 101 - 111 mmol/L   CO2 37 (H) 22 - 32 mmol/L   Glucose, Bld 136 (H) 65 - 99 mg/dL   BUN 16 6 - 20 mg/dL   Creatinine, Ser 7.82 0.61 - 1.24 mg/dL   Calcium 8.7 (L) 8.9 - 10.3 mg/dL   Total Protein 6.5 6.5 - 8.1 g/dL   Albumin 3.4 (L) 3.5 - 5.0 g/dL   AST 20 15 - 41 U/L   ALT 32 17 - 63  U/L   Alkaline Phosphatase 61 38 -  126 U/L   Total Bilirubin 0.7 0.3 - 1.2 mg/dL   GFR calc non Af Amer >60 >60 mL/min   GFR calc Af Amer >60 >60 mL/min   Anion gap 5 5 - 15  Lipase, blood     Status: None   Collection Time: 02/14/15  6:00 PM  Result Value Ref Range   Lipase 29 11 - 51 U/L  D-dimer, quantitative (not at [facility])     Status: None   Collection Time: 02/14/15  6:00 PM  Result Value Ref Range   D-Dimer, Quant 0.31 0.00 - 0.50 ug/mL-FEU  Troponin I     Status: None   Collection Time: 02/14/15  6:00 PM  Result Value Ref Range   Troponin I <0.03 <0.031 ng/mL  Brain natriuretic peptide     Status: None   Collection Time: 02/14/15  6:00 PM  Result Value Ref Range   B Natriuretic Peptide 28.9 0.0 - 100.0 pg/mL  I-Stat Arterial Blood Gas, ED - (order at [facility] and [facility] only)     Status: Abnormal   Collection Time: 02/14/15  7:59 PM  Result Value Ref Range   pH, Arterial 7.290 (L) 7.350 - 7.450   pCO2 arterial 70.7 (HH) 35.0 - 45.0 mmHg   pO2, Arterial 58.0 (L) 80.0 - 100.0 mmHg   Bicarbonate 34.0 (H) 20.0 - 24.0 mEq/L   TCO2 36 0 - 100 mmol/L   O2 Saturation 85.0 %   Acid-Base Excess 4.0 (H) 0.0 - 2.0 mmol/L   Patient temperature 98.6 F    Collection site RADIAL, ALLEN'S TEST ACCEPTABLE    Drawn by Operator    Sample type ARTERIAL    Comment NOTIFIED PHYSICIAN     UA ordered  No results found for: HGBA1C  Estimated Creatinine Clearance: 125.3 mL/min (by C-G formula based on Cr of 0.94).  BNP (last 3 results) No results for input(s): PROBNP in the last 8760 hours.  Other results:  I have pearsonaly reviewed this: ECG REPORT  Rate: 92  Rhythm: S rhythm ST&T Change: no Ischemic changes QTC 422  Filed Weights   02/14/15 2148 02/15/15 0044  Weight: 117.935 kg (260 lb) 120.1 kg (264 lb 12.4 oz)     Cultures: No results found for: SDES, SPECREQUEST, CULT, REPTSTATUS   Radiological Exams on Admission: Dg Chest 2 View  02/14/2015  CLINICAL DATA:  Shortness of breath and weakness for 3 days.  Initial encounter. EXAM: CHEST  2 VIEW COMPARISON:  None. FINDINGS: There is no consolidative process, pneumothorax or effusion. Heart size is normal. Calcific density projecting over the right lower lung zone seen on the frontal view only and may be pleural-based. The IMPRESSION: No acute disease. Electronically Signed   By: Drusilla Kanner M.D.   On: 02/14/2015 19:02   Ct Angio Chest Pe W/cm &/or Wo Cm  02/14/2015  CLINICAL DATA:  Shortness of breath and cough for 2 weeks. History of COPD. EXAM: CT ANGIOGRAPHY CHEST WITH CONTRAST TECHNIQUE: Multidetector CT imaging of the chest was performed using the standard protocol during bolus administration of intravenous contrast. Multiplanar CT image reconstructions and MIPs were obtained to evaluate the vascular anatomy. CONTRAST:  OMNIPAQUE IOHEXOL 350 MG/ML SOLN COMPARISON:  Chest radiograph 02/14/2015 FINDINGS: Mediastinum/Lymph Nodes: No pulmonary emboli or thoracic aortic dissection identified. No masses or pathologically enlarged lymph nodes identified. The heart is normal in size. There is mild calcified atherosclerotic disease. No evidence of pericardial effusion.  Lungs/Pleura: No pulmonary mass or effusion. There are bibasilar peribronchovascular linear opacities in dependent distribution, likely representing atelectasis. No evidence of mucous plugging in the main, lobar and segmental bronchi. Upper abdomen: No acute findings. Musculoskeletal: No chest wall mass or suspicious bone lesions identified. Review of the MIP images confirms the above findings. IMPRESSION: No evidence of pulmonary embolus or thoracic dissection. Mild calcific atherosclerotic disease of the coronary arteries. Bibasilar peribronchovascular linear opacities in dependent distribution, likely representing subsegmental atelectasis. Airspace consolidation due to pneumonia although possible is felt less likely. Electronically Signed   By: Ted Mcalpine M.D.   On: 02/14/2015 20:48    Dg Abd 2 Views  02/14/2015  CLINICAL DATA:  Shortness of breath and weakness with tightness and abdomen. EXAM: ABDOMEN - 2 VIEW COMPARISON:  None. FINDINGS: Upright film shows no evidence for intraperitoneal free air. There is no evidence for gaseous bowel dilation to suggest obstruction. No unexpected abdominal pelvic calcification. Curvilinear density overlying the dome of the right hemidiaphragm is presumably atelectasis in the right lower lobe. IMPRESSION: No evidence for bowel perforation or obstruction. Electronically Signed   By: Kennith Center M.D.   On: 02/14/2015 19:04    Chart has been reviewed  Family not at  Bedside   Assessment/Plan  50 year old gentleman with past history of hypertension presents with acute respiratory failure hypoxia and hypercarbia patient has history of tobacco abuse may have history of COPD  Present on Admission:  . Acute respiratory failure with hypoxia and hypercarbia (HCC) -it is unclear if patient may have some CO2 retention chronically. He denies being on oxygen likely this is an acute change. Suspected COPD exacerbation admit to stepdown continue BiPAP be CCM consult has been called  Suspected COPD exacerbation we'll continue with antibiotic coverage. When necessary albuterol scheduled Atrovent and steroids will benefit from PFTs as an outpatient  . Essential hypertension -given lower extremity swelling to hold off on Norvasc at this point blood pressure stable continue to monitor obtain echogram and can make a decision regarding long-term management  Given lower extremity edema and dyspnea will obtain echogram to evaluate for any right heart failure  . Tobacco abuse - Regarding Importance of Quitting, Ordered Nicotine Patch and Tobacco Cessation Protocol     Prophylaxis: Lovenox   CODE STATUS:  FULL CODE   as per patient    Disposition:   To home once workup is complete and patient is stable  Other plan as per orders.  I have spent a total of  67 min on this admission  Anola Mcgough 02/15/2015, 3:37 AM   [facility]  Pager [phone]   after 2 AM please page floor coverage PA If 7AM-7PM, please contact the day team taking care of the patient  [email or web address]  Password [email or web address]

## 2015-02-15 NOTE — Progress Notes (Signed)
Pt currently on BIPAP at this time no distress noted.

## 2015-02-15 NOTE — Progress Notes (Signed)
Rt tried to place pt on 55%vm pt sats went to 85%. Rt placed back on BIPAP.

## 2015-02-15 NOTE — Progress Notes (Signed)
Pt has critical CO2 of 78.5. Midlevel informed. Advised to call critical care team.

## 2015-02-15 NOTE — Progress Notes (Signed)
  Echocardiogram 2D Echocardiogram has been performed.  Arvil Chaco 02/15/2015, 11:58 AM

## 2015-02-15 NOTE — Consult Note (Signed)
Name: Bryan Gillespie MRN: [id number] DOB: 11/10/1965    ADMISSION DATE:  02/14/2015 CONSULTATION DATE:  1/24  REFERRING MD :  Dr. Adela Glimpse  CHIEF COMPLAINT:  SOB  SIGNIFICANT EVENTS  1/23 admit  STUDIES:  1/23 CT angio chest > No evidence of pulmonary embolus or thoracic dissection. Mild calcific atherosclerotic disease of the coronary arteries. Bibasilar peribronchovascular linear opacities in dependent distribution, likely representing subsegmental atelectasis. Airspace consolidation due to pneumonia although possible is felt less likely.   HISTORY OF PRESENT ILLNESS:  50 year old male with PMH as below, which includes HTN, bowel obstruction, and neuropathy. He presented to ED with c/o progressive SOB x 3 days. 1/23 when attempting to ambulate to neighbors house he became profoundly dyspneic, and felt as though "he was going to die". He called EMS. In the ED he reported recent diagnoses of flu and bronchitis. He was found to have ABG 7.290/CO2 70.7 and PO2 of 58.0. He was started on BiPAP. He underwent CTA of the chest with concern for PE, scan was only positive for some ATX vs less likely PNA. He was admitted to SDU under hospitalist for COPD exacerbation, however, he does not carry a formal diagnosis of this. He is a long time and current every day smoker. Marland Kitchen  PAST MEDICAL HISTORY :   has a past medical history of Hypertension; Bowel obstruction (HCC); and Peripheral neuropathy (HCC).  has past surgical history that includes Abdominal surgery and Hernia repair. Prior to Admission medications   Medication Sig Start Date End Date Taking? Authorizing Provider  amLODipine (NORVASC) 10 MG tablet Take 10 mg by mouth daily.   Yes Historical Provider, MD   No Known Allergies  FAMILY HISTORY:  family history includes Diabetes in his mother; Hypertension in his other. SOCIAL HISTORY:  reports that he has been smoking.  He does not have any smokeless tobacco history on file. He reports that he  does not drink alcohol or use illicit drugs.  REVIEW OF SYSTEMS:   Limited due to AMS and BiPAP  SUBJECTIVE:   VITAL SIGNS: Temp:  [97.8 F (36.6 C)-98.9 F (37.2 C)] 97.8 F (36.6 C) (01/24 0328) Pulse Rate:  [89-96] 93 (01/24 0416) Resp:  [15-25] 19 (01/24 0416) BP: (115-142)/(81-107) 129/92 mmHg (01/24 0044) SpO2:  [86 %-97 %] 91 % (01/24 0416) FiO2 (%):  [50 %] 50 % (01/23 2230) Weight:  [117.935 kg (260 lb)-120.1 kg (264 lb 12.4 oz)] 120.1 kg (264 lb 12.4 oz) (01/24 0044)  PHYSICAL EXAMINATION: General:  Obese male on BiPAP Neuro:  Somnolent, easily arousable HEENT:  [place]/AT, PERRL, no JVD noted Cardiovascular:  RRR, no MRG Lungs:  Poor air entry throughout, no obvious wheeze Abdomen:  Obese, soft, non-tender, non-distended Musculoskeletal: No acute deformity or ROM limitation. Skin:  Grossly intact   Recent Labs Lab 02/14/15 1800  NA 139  K 4.5  CL 97*  CO2 37*  BUN 16  CREATININE 0.94  GLUCOSE 136*    Recent Labs Lab 02/14/15 1800  HGB 18.0*  HCT 57.7*  WBC 5.5  PLT 136*   Dg Chest 2 View  02/14/2015  CLINICAL DATA:  Shortness of breath and weakness for 3 days. Initial encounter. EXAM: CHEST  2 VIEW COMPARISON:  None. FINDINGS: There is no consolidative process, pneumothorax or effusion. Heart size is normal. Calcific density projecting over the right lower lung zone seen on the frontal view only and may be pleural-based. The IMPRESSION: No acute disease. Electronically Signed   By:  Drusilla Kanner M.D.   On: 02/14/2015 19:02   Ct Angio Chest Pe W/cm &/or Wo Cm  02/14/2015  CLINICAL DATA:  Shortness of breath and cough for 2 weeks. History of COPD. EXAM: CT ANGIOGRAPHY CHEST WITH CONTRAST TECHNIQUE: Multidetector CT imaging of the chest was performed using the standard protocol during bolus administration of intravenous contrast. Multiplanar CT image reconstructions and MIPs were obtained to evaluate the vascular anatomy. CONTRAST:  OMNIPAQUE IOHEXOL  350 MG/ML SOLN COMPARISON:  Chest radiograph 02/14/2015 FINDINGS: Mediastinum/Lymph Nodes: No pulmonary emboli or thoracic aortic dissection identified. No masses or pathologically enlarged lymph nodes identified. The heart is normal in size. There is mild calcified atherosclerotic disease. No evidence of pericardial effusion. Lungs/Pleura: No pulmonary mass or effusion. There are bibasilar peribronchovascular linear opacities in dependent distribution, likely representing atelectasis. No evidence of mucous plugging in the main, lobar and segmental bronchi. Upper abdomen: No acute findings. Musculoskeletal: No chest wall mass or suspicious bone lesions identified. Review of the MIP images confirms the above findings. IMPRESSION: No evidence of pulmonary embolus or thoracic dissection. Mild calcific atherosclerotic disease of the coronary arteries. Bibasilar peribronchovascular linear opacities in dependent distribution, likely representing subsegmental atelectasis. Airspace consolidation due to pneumonia although possible is felt less likely. Electronically Signed   By: Ted Mcalpine M.D.   On: 02/14/2015 20:48   Dg Abd 2 Views  02/14/2015  CLINICAL DATA:  Shortness of breath and weakness with tightness and abdomen. EXAM: ABDOMEN - 2 VIEW COMPARISON:  None. FINDINGS: Upright film shows no evidence for intraperitoneal free air. There is no evidence for gaseous bowel dilation to suggest obstruction. No unexpected abdominal pelvic calcification. Curvilinear density overlying the dome of the right hemidiaphragm is presumably atelectasis in the right lower lobe. IMPRESSION: No evidence for bowel perforation or obstruction. Electronically Signed   By: Kennith Center M.D.   On: 02/14/2015 19:04    ASSESSMENT / PLAN:  Acute on chronic hypoxemic/hypercarbic respiratory failure  COPD with acute exacerbation ?CAP Probable OSA  P: -Continuous BiPAP -Repeat ABG this AM -Scheduled and PRN bronchodilators -IV  steroids, taper as able -Continue CAP coverage (ceftriaxone and azithromycin) per primary team -Limit sedating meds -Will need pulmonary follow up and PFTs  Joneen Roach, AGACNP-BC [facility] Pulmonology/Critical Care Pager [phone] or [phone]  02/15/2015 4:36 AM   Attending Note:  I have examined patient, reviewed labs, studies and notes. I have discussed the case with Henreitta Leber, and I agree with the data and plans as amended above. Pt with acute (probably on chronic) combined resp failure. Suspect precipitating factor was AE-COPD in setting URI, now with possible su[perimposed PNA. Contributors are obesity, OSA. On my eval he is comfortable but hypoxemic on BiPAP 12/6. I will increase to 15/10, probably increase Fio2. Continue BiPAP continuous for now. If unable to get him off BiPAp by this pm then likely move to ICU. Independent critical care time is 35 minutes.   Levy Pupa, MD, PhD 02/15/2015, 7:48 AM [facility] [phone] or if no answer [phone]

## 2015-02-15 NOTE — Progress Notes (Signed)
E-link informed of pt's  CO2 of 78.5. Will continue to monitor.

## 2015-02-16 DIAGNOSIS — R0902 Hypoxemia: Secondary | ICD-10-CM | POA: Diagnosis present

## 2015-02-16 DIAGNOSIS — J189 Pneumonia, unspecified organism: Secondary | ICD-10-CM

## 2015-02-16 DIAGNOSIS — Z720 Tobacco use: Secondary | ICD-10-CM

## 2015-02-16 LAB — BLOOD GAS, ARTERIAL
ACID-BASE EXCESS: 9.6 mmol/L — AB (ref 0.0–2.0)
Acid-Base Excess: 8.3 mmol/L — ABNORMAL HIGH (ref 0.0–2.0)
BICARBONATE: 36.2 meq/L — AB (ref 20.0–24.0)
Bicarbonate: 33.6 mEq/L — ABNORMAL HIGH (ref 20.0–24.0)
DRAWN BY: 41875
Drawn by: 274071
Expiratory PAP: 6
Inspiratory PAP: 12
Mode: POSITIVE
O2 CONTENT: 15 L/min
O2 SAT: 89.2 %
O2 Saturation: 89.9 %
PATIENT TEMPERATURE: 98.6
PCO2 ART: 78.5 mmHg — AB (ref 35.0–45.0)
PCO2 ART: 59.3 mmHg — AB (ref 35.0–45.0)
PH ART: 7.286 — AB (ref 7.350–7.450)
PH ART: 7.372 (ref 7.350–7.450)
PO2 ART: 62.6 mmHg — AB (ref 80.0–100.0)
Patient temperature: 98.6
RATE: 7 resp/min
TCO2: 38.6 mmol/L (ref 0–100)
TCO2: 35.4 mmol/L (ref 0–100)
pO2, Arterial: 63.3 mmHg — ABNORMAL LOW (ref 80.0–100.0)

## 2015-02-16 LAB — LEGIONELLA ANTIGEN, URINE

## 2015-02-16 LAB — HEMOGLOBIN A1C
HEMOGLOBIN A1C: 5.9 % — AB (ref 4.8–5.6)
MEAN PLASMA GLUCOSE: 123 mg/dL

## 2015-02-16 MED ORDER — IPRATROPIUM-ALBUTEROL 0.5-2.5 (3) MG/3ML IN SOLN
3.0000 mL | Freq: Four times a day (QID) | RESPIRATORY_TRACT | Status: DC
  Administered 2015-02-16 – 2015-02-18 (×6): 3 mL via RESPIRATORY_TRACT
  Filled 2015-02-16 (×6): qty 3

## 2015-02-16 MED ORDER — AZITHROMYCIN 500 MG PO TABS
500.0000 mg | ORAL_TABLET | Freq: Every day | ORAL | Status: DC
  Administered 2015-02-16 – 2015-02-17 (×2): 500 mg via ORAL
  Filled 2015-02-16 (×4): qty 1

## 2015-02-16 MED ORDER — PREDNISONE 20 MG PO TABS
40.0000 mg | ORAL_TABLET | Freq: Every day | ORAL | Status: DC
  Administered 2015-02-17 – 2015-02-21 (×5): 40 mg via ORAL
  Filled 2015-02-16 (×5): qty 2

## 2015-02-16 NOTE — Progress Notes (Signed)
[facility] TEAM 1 - Stepdown/ICU TEAM PROGRESS NOTE  Bryan Gillespie HYQ:[id number] DOB: [birth year] DOA: 02/14/2015 PCP: Pcp Not In System  Admit HPI / Brief Narrative: 49 y.o.M Hx Hypertension; Bowel obstruction; Peripheral neuropathy, and Tobacco Abuse who presented with worsening shortness of breath over 3 days associated generalized weakness.  Patient stated he had to be intubated 2 years ago for similar sx.   In emergency department was found to have ABG 7.290/CO2 70.7 and PO2 of 58.0.  Troponin was normal - hemoglobin elevated at 18 - PCO2 of 37 - CTA of the chest did not show any evidence of PE.  HPI/Subjective: The patient is resting comfortably in bed.  He is currently not in acute distress but is requiring oxygen and his work of breathing is somewhat increased.  He is able to complete full sentences.  He states he feels much better.  He denies chest pain nausea vomiting fevers chills or abdominal pain.  He admits to smoking one to 2 packs a day and states he began smoking as a teenager.  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 - continue aggressive medical therapy - consider outpatient evaluation for alpha-1 antitrypsin deficiency given report a family history of "many people with very severe emphysema at a young age" in his family - may very well require home oxygen - will definitely need to be educated on use of inhalers and nebulizers  Essential hypertension  Blood pressure currently very well controlled  Tobacco abuse I have advised the patient that he must absolutely discontinue smoking completely and permanently to include marijuana - I have explained to him that if he continues to smoke he will die and that it will likely occur within the next year - I have also advised him that he should avoid any exposure to secondhand  smoke  Polycythemia Due to above - follow  Code Status: FULL Family Communication: Spoke with patient's mother at bedside Disposition Plan: SDU  Consultants: PCCM  Procedures: TTE - 1/24 - EF 60-65 percent - grade 1 diastolic dysfunction - no significant valvular abnormalities - mild/moderately dilated RV  Antibiotics: Azithromycin 1/24 > Ceftriaxone 1/24 >  DVT prophylaxis: lovenox   Objective: Blood pressure 117/89, pulse 100, temperature 98.1 F (36.7 C), temperature source Oral, resp. rate 20, height  (1.803 m), weight 120.1 kg (264 lb 12.4 oz), SpO2 92 %.  Intake/Output Summary (Last 24 hours) at 02/16/15 1107 Last data filed at 02/16/15 0924  Gross per 24 hour  Intake    660 ml  Output   3150 ml  Net  -2490 ml   Exam: General: Alert and conversant in mild to moderate respiratory difficulty Lungs: Very poor air movement throughout all fields - no active wheeze or focal crackles appreciable Cardiovascular: Very distant heart sounds - RRR - no appreciable murmur Abdomen: Nontender, nondistended, soft, bowel sounds positive, no rebound, no ascites, no appreciable mass Extremities: No significant cyanosis, or clubbing;  Trace edema bilateral lower extremities  Data Reviewed:  Basic Metabolic Panel:  Recent Labs Lab 02/14/15 1800 02/15/15 0902  NA 139 139  K 4.5 5.1  CL 97* 98*  CO2 37* 35*  GLUCOSE 136* 117*  BUN 16 9  CREATININE 0.94 0.93  CALCIUM 8.7* 8.9  MG  --  2.0  PHOS  --  4.4    CBC:  Recent  Labs Lab 02/14/15 1800 02/15/15 0902  WBC 5.5 6.5  HGB 18.0* 18.6*  HCT 57.7* 59.4*  MCV 99.5 101.9*  PLT 136* 135*    Liver Function Tests:  Recent Labs Lab 02/14/15 1800 02/15/15 0902  AST 20 19  ALT 32 32  ALKPHOS 61 71  BILITOT 0.7 1.3*  PROT 6.5 6.7  ALBUMIN 3.4* 3.4*    Recent Labs Lab 02/14/15 1800  LIPASE 29   Coags:  Recent Labs Lab 02/15/15 0902  INR 1.18   Cardiac Enzymes:  Recent Labs Lab  02/14/15 1800 02/15/15 0248 02/15/15 0902 02/15/15 1508  TROPONINI <0.03 <0.03 <0.03 <0.03    Recent Results (from the past 240 hour(s))  MRSA PCR Screening     Status: None   Collection Time: 02/15/15 10:58 AM  Result Value Ref Range Status   MRSA by PCR NEGATIVE NEGATIVE Final    Comment:        The GeneXpert MRSA Assay (FDA approved for NASAL specimens only), is one component of a comprehensive MRSA colonization surveillance program. It is not intended to diagnose MRSA infection nor to guide or monitor treatment for MRSA infections.      Studies:   Recent x-ray studies have been reviewed in detail by the Attending Physician  Scheduled Meds:  Scheduled Meds: . arformoterol  15 mcg Nebulization BID  . aspirin EC  81 mg Oral Daily  . azithromycin  500 mg Intravenous Q24H  . cefTRIAXone (ROCEPHIN)  IV  1 g Intravenous Q24H  . dextromethorphan-guaiFENesin  1 tablet Oral BID  . enoxaparin (LOVENOX) injection  40 mg Subcutaneous Daily  . ipratropium-albuterol  3 mL Nebulization Q6H  . methylPREDNISolone (SOLU-MEDROL) injection  40 mg Intravenous 4 times per day  . nicotine  21 mg Transdermal Daily  . sodium chloride  3 mL Intravenous Q12H    Time spent on care of this patient: 35 mins   Faiza Bansal T , MD   [facility] Office  [phone] Pager - Text Page per Loretha Stapler as per below:  On-Call/Text Page:      [email or web address]      password [email or web address]  If 7PM-7AM, please contact night-coverage [email or web address] Password [email or web address] 02/16/2015, 11:07 AM   LOS: 2 days

## 2015-02-16 NOTE — Progress Notes (Signed)
Informed Minor NP of critical value pCO2 59.3. No new orders at this time.

## 2015-02-16 NOTE — Progress Notes (Signed)
CRITICAL VALUE ALERT  Critical value received:  PCO2 59.3  Date of notification:  02/16/15  Time of notification:  1225  Critical value read back:Yes.    Nurse who received alert:  Nysha Koplin  MD notified (1st page):  Chrissie Noa Minor NP  Time of first page:  1230  MD notified (2nd page):  Time of second page:  Responding MD:  Hart Rochester NP  Time MD responded:  [phone]

## 2015-02-16 NOTE — Care Management Note (Addendum)
Case Management Note  Patient Details  Name: Fergus Throne MRN: [id number] Date of Birth: [birth year]  Subjective/Objective:   Date:02/16/15 Spoke with patient at the bedside. Introduced self as Sports coach and explained role in discharge planning and how to be reached. Verified patient lives in town, with mother, he is independent. Expressed potential need for home oxygen and nebulizer machine, will have to check with [facility] for charity.  Verified patient anticipates to go home with mother at time of discharge and will have part-time supervision by mother at this time to best of their knowledge. Patient confirmed needing help with their medication, he has no insurance, [facility] [address]  Patient is driven by mother to MD appointments.  Verified patient has PCP [facility] Blade [facility]   Most likely may need home oxygen, [facility] will check to see if patient qualifies for charity ,since has no insurance.  Plan: CM will continue to follow for discharge planning and [facility] resources.                  Action/Plan:   Expected Discharge Date:                  Expected Discharge Plan:  Home/Self Care  In-House Referral:     Discharge planning Services  CM Consult  Post Acute Care Choice:    Choice offered to:     DME Arranged:    DME Agency:     HH Arranged:    HH Agency:     Status of Service:  In process, will continue to follow  Medicare Important Message Given:    Date Medicare IM Given:    Medicare IM give by:    Date Additional Medicare IM Given:    Additional Medicare Important Message give by:     If discussed at Long Length of Stay Meetings, dates discussed:    Additional Comments:  Leone Haven, RN 02/16/2015, 5:28 PM

## 2015-02-16 NOTE — Progress Notes (Signed)
Name: Bryan Gillespie MRN: [id number] DOB: [birth year]    ADMISSION DATE:  02/14/2015 CONSULTATION DATE:  1/24  REFERRING MD :  Dr. Adela Glimpse  CHIEF COMPLAINT:  SOB  SIGNIFICANT EVENTS  1/23 admit  STUDIES:  1/23 CT angio chest > No evidence of pulmonary embolus or thoracic dissection. Mild calcific atherosclerotic disease of the coronary arteries. Bibasilar peribronchovascular linear opacities in dependent distribution, likely representing subsegmental atelectasis. Airspace consolidation due to pneumonia although possible is felt less likely.   HISTORY OF PRESENT ILLNESS:  50 year old male with PMH as below, which includes HTN, bowel obstruction, and neuropathy. He presented to ED with c/o progressive SOB x 3 days. 1/23 when attempting to ambulate to neighbors house he became profoundly dyspneic, and felt as though "he was going to die". He called EMS. In the ED he reported recent diagnoses of flu and bronchitis. He was found to have ABG 7.290/CO2 70.7 and PO2 of 58.0. He was started on BiPAP. He underwent CTA of the chest with concern for PE, scan was only positive for some ATX vs less likely PNA. He was admitted to SDU under hospitalist for COPD exacerbation, however, he does not carry a formal diagnosis of this. He is a long time and current every day smoker. .    SUBJECTIVE:   VITAL SIGNS: Temp:  [98.1 F (36.7 C)-98.8 F (37.1 C)] 98.1 F (36.7 C) (01/25 0757) Pulse Rate:  [86-111] 100 (01/25 0411) Resp:  [17-22] 20 (01/25 0411) BP: (106-135)/(71-89) 117/89 mmHg (01/25 0411) SpO2:  [88 %-93 %] 92 % (01/25 0804) FiO2 (%):  [55 %] 55 % (01/24 2316)  PHYSICAL EXAMINATION: General:  Obese male refuses bipap on 15 l [place] with sats 89% Neuro:  Awake and follows commands. Refuses bipap HEENT:  /AT, PERRL, no JVD noted Cardiovascular:  RRR, no MRG Lungs:  Poor air entry throughout, no obvious wheeze Abdomen:  Obese, soft, non-tender, non-distended Musculoskeletal: No acute deformity  or ROM limitation. Skin:  Grossly intact   Recent Labs Lab 02/14/15 1800 02/15/15 0902  NA 139 139  K 4.5 5.1  CL 97* 98*  CO2 37* 35*  BUN 16 9  CREATININE 0.94 0.93  GLUCOSE 136* 117*    Recent Labs Lab 02/14/15 1800 02/15/15 0902  HGB 18.0* 18.6*  HCT 57.7* 59.4*  WBC 5.5 6.5  PLT 136* 135*   Dg Chest 2 View  02/14/2015  CLINICAL DATA:  Shortness of breath and weakness for 3 days. Initial encounter. EXAM: CHEST  2 VIEW COMPARISON:  None. FINDINGS: There is no consolidative process, pneumothorax or effusion. Heart size is normal. Calcific density projecting over the right lower lung zone seen on the frontal view only and may be pleural-based. The IMPRESSION: No acute disease. Electronically Signed   By: Drusilla Kanner M.D.   On: 02/14/2015 19:02   Ct Angio Chest Pe W/cm &/or Wo Cm  02/14/2015  CLINICAL DATA:  Shortness of breath and cough for 2 weeks. History of COPD. EXAM: CT ANGIOGRAPHY CHEST WITH CONTRAST TECHNIQUE: Multidetector CT imaging of the chest was performed using the standard protocol during bolus administration of intravenous contrast. Multiplanar CT image reconstructions and MIPs were obtained to evaluate the vascular anatomy. CONTRAST:  OMNIPAQUE IOHEXOL 350 MG/ML SOLN COMPARISON:  Chest radiograph 02/14/2015 FINDINGS: Mediastinum/Lymph Nodes: No pulmonary emboli or thoracic aortic dissection identified. No masses or pathologically enlarged lymph nodes identified. The heart is normal in size. There is mild calcified atherosclerotic disease. No evidence of pericardial  effusion. Lungs/Pleura: No pulmonary mass or effusion. There are bibasilar peribronchovascular linear opacities in dependent distribution, likely representing atelectasis. No evidence of mucous plugging in the main, lobar and segmental bronchi. Upper abdomen: No acute findings. Musculoskeletal: No chest wall mass or suspicious bone lesions identified. Review of the MIP images confirms the above  findings. IMPRESSION: No evidence of pulmonary embolus or thoracic dissection. Mild calcific atherosclerotic disease of the coronary arteries. Bibasilar peribronchovascular linear opacities in dependent distribution, likely representing subsegmental atelectasis. Airspace consolidation due to pneumonia although possible is felt less likely. Electronically Signed   By: Ted Mcalpine M.D.   On: 02/14/2015 20:48   Dg Abd 2 Views  02/14/2015  CLINICAL DATA:  Shortness of breath and weakness with tightness and abdomen. EXAM: ABDOMEN - 2 VIEW COMPARISON:  None. FINDINGS: Upright film shows no evidence for intraperitoneal free air. There is no evidence for gaseous bowel dilation to suggest obstruction. No unexpected abdominal pelvic calcification. Curvilinear density overlying the dome of the right hemidiaphragm is presumably atelectasis in the right lower lobe. IMPRESSION: No evidence for bowel perforation or obstruction. Electronically Signed   By: Kennith Center M.D.   On: 02/14/2015 19:04    ASSESSMENT / PLAN:  Acute on chronic hypoxemic/hypercarbic respiratory failure , ABG with baseline pCO2 ~ 60 COPD with acute exacerbation, improved ?CAP Probable OSA (refuses bipap 1/25)  P: -would benefit from CPAP or BiPAp qhs > he has refused today 1/25 -Scheduled and PRN bronchodilators: change duoneb to qid, stop brovana (redundant) -IV steroids > convert to Pred PO and taper over about 10 days.  -Continue CAP coverage (ceftriaxone and azithromycin) per primary team -Limit sedating meds, dc'd MSO4 -Will need pulmonary follow up and full PFTs, likely a PSG as well if he will allow - bubble study to r/o shunt    Brett Canales Minor ACNP [facility] Pager [phone] till 3 pm If no answer page [phone] 02/16/2015, 9:48 AM   Attending Note:  I have examined patient, reviewed labs, studies and notes. I have discussed the case with S Minor, and I agree with the data and plans as amended above. Off BiPAP  and tolerating. Hypoxemia a bit better but still significant. Likely needs CPAP qhs. Would complete therapy for AE-COPD and CAP, convert meds to PO. Wean O2 as able. Given degree of hypoxemia, consider A-V shunt > bubble study.   Levy Pupa, MD, PhD 02/16/2015, 3:25 PM [facility] [phone] or if no answer [phone]

## 2015-02-17 ENCOUNTER — Inpatient Hospital Stay (HOSPITAL_COMMUNITY): Payer: Self-pay

## 2015-02-17 ENCOUNTER — Inpatient Hospital Stay (HOSPITAL_COMMUNITY): Payer: MEDICAID

## 2015-02-17 LAB — COMPREHENSIVE METABOLIC PANEL
ALBUMIN: 3.1 g/dL — AB (ref 3.5–5.0)
ALT: 29 U/L (ref 17–63)
AST: 18 U/L (ref 15–41)
Alkaline Phosphatase: 55 U/L (ref 38–126)
Anion gap: 10 (ref 5–15)
BUN: 15 mg/dL (ref 6–20)
CHLORIDE: 96 mmol/L — AB (ref 101–111)
CO2: 37 mmol/L — ABNORMAL HIGH (ref 22–32)
Calcium: 8.9 mg/dL (ref 8.9–10.3)
Creatinine, Ser: 0.92 mg/dL (ref 0.61–1.24)
GFR calc Af Amer: >60 mL/min (ref >60)
GFR calc non Af Amer: >60 mL/min (ref >60)
GLUCOSE: 96 mg/dL (ref 65–99)
POTASSIUM: 4.7 mmol/L (ref 3.5–5.1)
Sodium: 143 mmol/L (ref 135–145)
Total Bilirubin: 0.7 mg/dL (ref 0.3–1.2)
Total Protein: 6 g/dL — ABNORMAL LOW (ref 6.5–8.1)

## 2015-02-17 LAB — CBC WITH DIFFERENTIAL/PLATELET
Basophils Absolute: 0 10*3/uL (ref 0.0–0.1)
Basophils Relative: 0 %
EOS PCT: 0 %
Eosinophils Absolute: 0 10*3/uL (ref 0.0–0.7)
HCT: 56.8 % — ABNORMAL HIGH (ref 39.0–52.0)
Hemoglobin: 17.9 g/dL — ABNORMAL HIGH (ref 13.0–17.0)
LYMPHS ABS: 1.3 10*3/uL (ref 0.7–4.0)
LYMPHS PCT: 11 %
MCH: 32.1 pg (ref 26.0–34.0)
MCHC: 31.5 g/dL (ref 30.0–36.0)
MCV: 101.8 fL — AB (ref 78.0–100.0)
MONO ABS: 0.6 10*3/uL (ref 0.1–1.0)
MONOS PCT: 5 %
Neutro Abs: 9.9 10*3/uL — ABNORMAL HIGH (ref 1.7–7.7)
Neutrophils Relative %: 84 %
PLATELETS: 118 10*3/uL — AB (ref 150–400)
RBC: 5.58 MIL/uL (ref 4.22–5.81)
RDW: 14.5 % (ref 11.5–15.5)
WBC: 11.8 10*3/uL — ABNORMAL HIGH (ref 4.0–10.5)

## 2015-02-17 LAB — MAGNESIUM: Magnesium: 2.3 mg/dL (ref 1.7–2.4)

## 2015-02-17 MED ORDER — NAPHAZOLINE-GLYCERIN 0.012-0.2 % OP SOLN
1.0000 [drp] | Freq: Four times a day (QID) | OPHTHALMIC | Status: DC | PRN
  Filled 2015-02-17: qty 15

## 2015-02-17 MED ORDER — NAPHAZOLINE-PHENIRAMINE 0.025-0.3 % OP SOLN
1.0000 [drp] | Freq: Four times a day (QID) | OPHTHALMIC | Status: DC | PRN
  Administered 2015-02-17 – 2015-02-24 (×2): 1 [drp] via OPHTHALMIC
  Filled 2015-02-17 (×3): qty 5

## 2015-02-17 NOTE — Progress Notes (Signed)
            TRANSCRANIAL DOPPLER BUBBLE STUDY   Mr. Bryan Gillespie Date of Birth:  Mar 17, 1965 Medical Record Number:  [id number]  Referring MD  : Dr Delton Coombes Indications: Diagnostic Date of Procedure: 02/17/2015 Clinical History: Evaluate for right to left shunt  Technical Description:   Transcranial Doppler Bubble Study was performed at the bedside after taking verbal informed consent from the patient and explaining risk/benefits. The right middle cerebral artery was insonated using a hand held probe by the technician. And IV line had been previously inserted in the left forearm by the RN using aseptic precautions. Agitated saline injection at rest and after valsalva maneuver  Did not result in high intensity transient signals (HITS).   Impression:  Negative Transcranial Doppler Bubble Study  not indicative of right to left intracardiac or pulmonary shunt.   Results were explained to the patient. Questions were answered.     Delia Heady, MD Medical Director [facility] Pager: [phone] 02/17/2015 3:38 PM

## 2015-02-17 NOTE — Progress Notes (Signed)
[place] TEAM 1 - Stepdown/ICU TEAM Progress Note  Bryan Gillespie ZOX:[id number] DOB: 1965-09-19 DOA: 02/14/2015 PCP: Pcp Not In System  Admit HPI / Brief Narrative: Bryan Gillespie is a 50 y.o. WM PMHx Hypertension; Bowel obstruction (HCC); and Peripheral neuropathy (HCC), Tobacco Abuse.   Presented with worsening shortness of breath over the past 3 days associated generalized weakness He reports today he try to walk over to his neighbor's house and developed acute worsening shortness of breath he was" felt like he is going to die" so he returned and called EMS. He reports that he has presented similar symptoms to outpatient pruritus and was diagnosed with bronchitis and flu. Patient states that he had to be intubated 2 years ago. There is no records in the system regarding this  In emergency department was found to have ABG 7.290/CO2 70.7 and PO2 of 58.0 was transiently on BiPAP Troponin was normal hemoglobin noted to be elevated at 18 PCO2 of 37 CTA of the chest did not show any evidence of PE some evidence of atelectasis but less likely pneumonia  Was discussed with [facility] M at this point felt the patient was stable for admission to stepdown  Hospitalist was called for admission for acute respiratory failure  HPI/Subjective: 1/26  A/O 4, NAD.   Assessment/Plan: Acute respiratory failure with hypoxia and hypercarbia (HCC)/COPD exacerbation  -it is unclear if patient may have some CO2 retention chronically. He denies being on oxygen likely this is an acute change.  -Most likely multifactorial to include COPD Exacerbation /CAPS, CHF? -Continue empiric antibiotics -DuoNeb QID -Sodium Medrol 40 mg Daily -Flutter valve when patient able to tolerate being off of BiPAP -Mucinex DM - Essential hypertension  -Currently BP soft hold all BP medication  -Given lower extremity edema and dyspnea will obtain echogram    Tobacco abuse  - Continue Nicotine Patch. -1/26 counseled patient on  seriousness of his illness, explained that if he continued to smoke this would result in his death. Patient stated he no longer was going to smoke. Continue: Code Status as Full     Code Status: FULL Family Communication: no family present at time of exam Disposition Plan: SNF?    Consultants: Rudi Heap [facility] M   Procedure/Significant Events: 1/23 CT angiogram PE protocol;- negative PE.-Bibasilar peribronchovascular linear opacities in dependent distribution, likely subsegmental atelectasis.Pneumonia although possible is felt less likely.   Culture 1/24 sputum culture not obtained 1/24 blood right arm/hand NGTD 1/24 MRSA by PCR negative 1/24 influenza A/B/H1N1 negative 1/24 virus panel pending 1/24 Legionella/strep pneumo urine antigen negative    Antibiotics: Azithromycin 1/24>> Ceftriaxone 1/24>>   DVT prophylaxis: Lovenox   Devices    LINES / TUBES:      Continuous Infusions:   Objective: VITAL SIGNS: Temp: 97.8 F (36.6 C) (01/26 1631) Temp Source: Oral (01/26 1631) BP: 121/89 mmHg (01/26 1631) Pulse Rate: 86 (01/26 1631) SPO2; FIO2:   Intake/Output Summary (Last 24 hours) at 02/17/15 2027 Last data filed at 02/17/15 1900  Gross per 24 hour  Intake   1290 ml  Output   2400 ml  Net  -1110 ml     Exam: General: A/O 4, NAD, positive Acute Respiratory Distress (on high flow nasal cannula) Eyes: Negative headache,negative scleral hemorrhage ENT: Negative Runny nose, negative gingival bleeding, Neck:  Negative scars, masses, torticollis, lymphadenopathy, JVD Lungs: diffuse poor air movement all lung fields (significantly improved), negative expiratory wheezes, negative crackles Cardiovascular: Regular rhythm and rate without murmur gallop or rub  normal S1 and S2 Abdomen:negative abdominal pain, nondistended, positive soft, bowel sounds, no rebound, no ascites, no appreciable mass Extremities: No significant cyanosis, clubbing, or edema  bilateral lower extremities Psychiatric:  Negative depression, negative anxiety, negative fatigue, negative mania  Neurologic:  Cranial nerves II through XII intact, tongue/uvula midline, all extremities muscle strength 5/5, sensation intact throughout,  negative dysarthria, negative expressive aphasia, negative receptive aphasia.   Data Reviewed: Basic Metabolic Panel:  Recent Labs Lab 02/14/15 1800 02/15/15 0902 02/17/15 0830  NA 139 139 143  K 4.5 5.1 4.7  CL 97* 98* 96*  CO2 37* 35* 37*  GLUCOSE 136* 117* 96  BUN CREATININE 0.94 0.93 0.92  CALCIUM 8.7* 8.9 8.9  MG  --  2.0 2.3  PHOS  --  4.4  --    Liver Function Tests:  Recent Labs Lab 02/14/15 1800 02/15/15 0902 02/17/15 0830  AST ALT 32 32 29  ALKPHOS 61 71 55  BILITOT 0.7 1.3* 0.7  PROT 6.5 6.7 6.0*  ALBUMIN 3.4* 3.4* 3.1*    Recent Labs Lab 02/14/15 1800  LIPASE 29   No results for input(s): AMMONIA in the last 168 hours. CBC:  Recent Labs Lab 02/14/15 1800 02/15/15 0902 02/17/15 0830  WBC 5.5 6.5 11.8*  NEUTROABS  --   --  9.9*  HGB 18.0* 18.6* 17.9*  HCT 57.7* 59.4* 56.8*  MCV 99.5 101.9* 101.8*  PLT 136* 135* 118*   Cardiac Enzymes:  Recent Labs Lab 02/14/15 1800 02/15/15 0248 02/15/15 0902 02/15/15 1508  TROPONINI <0.03 <0.03 <0.03 <0.03   BNP (last 3 results)  Recent Labs  02/14/15 1800  BNP 28.9    ProBNP (last 3 results) No results for input(s): PROBNP in the last 8760 hours.  CBG: No results for input(s): GLUCAP in the last 168 hours.  Recent Results (from the past 240 hour(s))  Culture, blood (routine x 2) Call MD if unable to obtain prior to antibiotics being given     Status: None (Preliminary result)   Collection Time: 02/15/15  3:38 AM  Result Value Ref Range Status   Specimen Description BLOOD RIGHT ARM  Final   Special Requests AEROBIC BOTTLE ONLY  Final   Culture NO GROWTH 2 DAYS  Final   Report Status PENDING  Incomplete    Culture, blood (routine x 2) Call MD if unable to obtain prior to antibiotics being given     Status: None (Preliminary result)   Collection Time: 02/15/15  4:08 AM  Result Value Ref Range Status   Specimen Description BLOOD RIGHT HAND  Final   Special Requests IN PEDIATRIC BOTTLE  Final   Culture NO GROWTH 2 DAYS  Final   Report Status PENDING  Incomplete  MRSA PCR Screening     Status: None   Collection Time: 02/15/15 10:58 AM  Result Value Ref Range Status   MRSA by PCR NEGATIVE NEGATIVE Final    Comment:        The GeneXpert MRSA Assay (FDA approved for NASAL specimens only), is one component of a comprehensive MRSA colonization surveillance program. It is not intended to diagnose MRSA infection nor to guide or monitor treatment for MRSA infections.      Studies:  Recent x-ray studies have been reviewed in detail by the Attending Physician  Scheduled Meds:  Scheduled Meds: . aspirin EC  81 mg Oral Daily  . azithromycin  500 mg Oral QHS  .  cefTRIAXone (ROCEPHIN)  IV  1 g Intravenous Q24H  . dextromethorphan-guaiFENesin  1 tablet Oral BID  . enoxaparin (LOVENOX) injection  40 mg Subcutaneous Daily  . ipratropium-albuterol  3 mL Nebulization QID  . nicotine  21 mg Transdermal Daily  . predniSONE  40 mg Oral Q breakfast  . sodium chloride  3 mL Intravenous Q12H    Time spent on care of this patient: 40 mins   Tracee Mccreery, Roselind Messier , MD  [facility] Office  [phone] Pager - [phone]  On-Call/Text Page:      [email or web address]      password [email or web address]  If 7PM-7AM, please contact night-coverage [email or web address] Password [email or web address] 02/17/2015, 8:27 PM   LOS: 3 days   Care during the described time interval was provided by me .  I have reviewed this patient's available data, including medical history, events of note, physical examination, and all test results as part of my evaluation. I have personally reviewed and interpreted all radiology studies.   Carolyne Littles,  MD [phone] Pager

## 2015-02-17 NOTE — Progress Notes (Signed)
Name: Bryan Gillespie MRN: [id number] DOB: 03-Oct-1965    ADMISSION DATE:  02/14/2015 CONSULTATION DATE:  1/24  REFERRING MD :  Dr. Adela Glimpse  CHIEF COMPLAINT:  SOB  SIGNIFICANT EVENTS  1/23 admit  STUDIES:  1/23 CT angio chest > No evidence of pulmonary embolus or thoracic dissection. Mild calcific atherosclerotic disease of the coronary arteries. Bibasilar peribronchovascular linear opacities in dependent distribution, likely representing subsegmental atelectasis. Airspace consolidation due to pneumonia although possible is felt less likely.   HISTORY OF PRESENT ILLNESS:  50 year old male with PMH as below, which includes HTN, bowel obstruction, and neuropathy. He presented to ED with c/o progressive SOB x 3 days. 1/23 when attempting to ambulate to neighbors house he became profoundly dyspneic, and felt as though "he was going to die". He called EMS. In the ED he reported recent diagnoses of flu and bronchitis. He was found to have ABG 7.290/CO2 70.7 and PO2 of 58.0. He was started on BiPAP. He underwent CTA of the chest with concern for PE, scan was only positive for some ATX vs less likely PNA. He was admitted to SDU under hospitalist for COPD exacerbation, however, he does not carry a formal diagnosis of this. He is a long time and current every day smoker. .    SUBJECTIVE:   VITAL SIGNS: Temp:  [97.8 F (36.6 C)-98.6 F (37 C)] 97.8 F (36.6 C) (01/26 0811) Pulse Rate:  [78-111] 78 (01/26 0402) Resp:  [13-19] 16 (01/26 0402) BP: (113-126)/(78-95) 114/83 mmHg (01/26 0402) SpO2:  [89 %-94 %] 94 % (01/26 0718)  PHYSICAL EXAMINATION: General:  Obese male refuses bipap on 15 l [place] with sats 89% Neuro:  Awake and follows commands. Refuses bipap HEENT:  [place]/AT, PERRL, no JVD noted Cardiovascular:  RRR, no MRG Lungs:  Poor air entry throughout, no obvious wheeze Abdomen:  Obese, soft, non-tender, non-distended Musculoskeletal: No acute deformity or ROM limitation. Skin:  Grossly  intact   Recent Labs Lab 02/14/15 1800 02/15/15 0902  NA 139 139  K 4.5 5.1  CL 97* 98*  CO2 37* 35*  BUN 16 9  CREATININE 0.94 0.93  GLUCOSE 136* 117*    Recent Labs Lab 02/14/15 1800 02/15/15 0902 02/17/15 0830  HGB 18.0* 18.6* 17.9*  HCT 57.7* 59.4* 56.8*  WBC 5.5 6.5 11.8*  PLT 136* 135* PENDING   Dg Chest Port 1 View  02/17/2015  CLINICAL DATA:  Respiratory failure EXAM: PORTABLE CHEST 1 VIEW COMPARISON:  02/14/2015 FINDINGS: Lungs are markedly under aerated. Bibasilar airspace disease has increased. No pneumothorax. Cardiac silhouette is prominent likely due to low volumes. IMPRESSION: Increased bibasilar airspace disease. Electronically Signed   By: Jolaine Click M.D.   On: 02/17/2015 07:45    ASSESSMENT / PLAN:  Acute on chronic hypoxemic/hypercarbic respiratory failure , ABG with baseline pCO2 ~ 60 COPD with acute exacerbation, improved ?CAP Probable OSA (refuses bipap 1/25)  P: -would benefit from CPAP or BiPAp qhs > he has been refusing -Scheduled and PRN bronchodilators: change duoneb to qid, stop brovana (redundant). Would recommend Spiriva +/- LABA/ICS for discharge.  -will need outpatient PSG and outpt PFT, pulmonary MD follow-up -Pred PO and taper over about 10 days.  -Continue CAP coverage (ceftriaxone and azithromycin) per primary team -Limit sedating meds, dc'd MSO4 -Will need pulmonary follow up and full PFTs, likely a PSG as well if he will allow - bubble study to r/o shunt pending     Levy Pupa, MD, PhD 02/17/2015, 9:14 AM [facility] [phone] or if no answer [phone]

## 2015-02-18 DIAGNOSIS — J449 Chronic obstructive pulmonary disease, unspecified: Secondary | ICD-10-CM | POA: Diagnosis present

## 2015-02-18 DIAGNOSIS — J441 Chronic obstructive pulmonary disease with (acute) exacerbation: Secondary | ICD-10-CM | POA: Diagnosis present

## 2015-02-18 LAB — RESPIRATORY VIRUS PANEL
Adenovirus: NEGATIVE
Influenza A: NEGATIVE
Influenza B: NEGATIVE
Metapneumovirus: NEGATIVE
PARAINFLUENZA 1 A: NEGATIVE
PARAINFLUENZA 2 A: NEGATIVE
PARAINFLUENZA 3 A: NEGATIVE
RESPIRATORY SYNCYTIAL VIRUS B: NEGATIVE
RHINOVIRUS: NEGATIVE
Respiratory Syncytial Virus A: NEGATIVE

## 2015-02-18 MED ORDER — SALINE SPRAY 0.65 % NA SOLN
1.0000 | NASAL | Status: DC | PRN
  Filled 2015-02-18: qty 44

## 2015-02-18 MED ORDER — ALBUTEROL SULFATE (2.5 MG/3ML) 0.083% IN NEBU: 5.0000 mg | INHALATION_SOLUTION | RESPIRATORY_TRACT | Status: DC | PRN

## 2015-02-18 MED ORDER — MOMETASONE FURO-FORMOTEROL FUM 100-5 MCG/ACT IN AERO
2.0000 | INHALATION_SPRAY | Freq: Two times a day (BID) | RESPIRATORY_TRACT | Status: DC
  Administered 2015-02-18 – 2015-02-24 (×14): 2 via RESPIRATORY_TRACT
  Filled 2015-02-18 (×2): qty 8.8

## 2015-02-18 MED ORDER — LEVOFLOXACIN 750 MG PO TABS
750.0000 mg | ORAL_TABLET | Freq: Every day | ORAL | Status: AC
  Administered 2015-02-19 – 2015-02-20 (×2): 750 mg via ORAL
  Filled 2015-02-18 (×2): qty 1

## 2015-02-18 MED ORDER — CARVEDILOL 3.125 MG PO TABS
3.1250 mg | ORAL_TABLET | Freq: Two times a day (BID) | ORAL | Status: DC
  Administered 2015-02-19 – 2015-02-25 (×13): 3.125 mg via ORAL
  Filled 2015-02-18 (×13): qty 1

## 2015-02-18 MED ORDER — TIOTROPIUM BROMIDE MONOHYDRATE 18 MCG IN CAPS
18.0000 ug | ORAL_CAPSULE | Freq: Every day | RESPIRATORY_TRACT | Status: DC
  Administered 2015-02-18 – 2015-02-23 (×6): 18 ug via RESPIRATORY_TRACT
  Filled 2015-02-18 (×4): qty 5

## 2015-02-18 NOTE — Progress Notes (Signed)
Nutrition Education Consult  RD consulted for nutrition education regarding weight loss.  Body mass index is 36.94 kg/(m^2). Pt meets criteria for obesity based on current BMI.  RD provided "Weight Loss Tips" handout from the Academy of [employer]. Emphasized the importance of serving sizes and provided examples of correct portions of common foods. Discussed importance of controlled and consistent intake throughout the day. Provided examples of ways to balance meals/snacks and encouraged intake of high-fiber, whole grain complex carbohydrates. Emphasized the importance of hydration with calorie-free beverages and limiting sugar-sweetened beverages. Encouraged pt to discuss physical activity options with physician. Teach back method used.  Expect good compliance.  Current diet order is regular, patient is consuming approximately 100% of meals at this time. Labs and medications reviewed. No further nutrition interventions warranted at this time. RD contact information provided. If additional nutrition issues arise, please re-consult RD.  Bryan Gillespie, RD, LDN, CNSC Pager [phone] After Hours Pager [phone]

## 2015-02-18 NOTE — Progress Notes (Signed)
Name: Bryan Gillespie MRN: [id number] DOB: 1965/06/10    ADMISSION DATE:  02/14/2015 CONSULTATION DATE:  1/24  REFERRING MD :  Dr. Adela Glimpse  CHIEF COMPLAINT:  SOB  SIGNIFICANT EVENTS  1/23 admit  STUDIES:  1/23 CT angio chest > No evidence of pulmonary embolus or thoracic dissection. Mild calcific atherosclerotic disease of the coronary arteries. Bibasilar peribronchovascular linear opacities in dependent distribution, likely representing subsegmental atelectasis. Airspace consolidation due to pneumonia, although possible, is felt less likely. 1/26 Transcranial doppler >> negative for any evidence for R > L shunting   HISTORY OF PRESENT ILLNESS:  50 year old male with PMH as below, which includes HTN, bowel obstruction, and neuropathy. He presented to ED with c/o progressive SOB x 3 days. 1/23 when attempting to ambulate to neighbors house he became profoundly dyspneic, and felt as though "he was going to die". He called EMS. In the ED he reported recent diagnoses of flu and bronchitis. He was found to have ABG 7.290/CO2 70.7 and PO2 of 58.0. He was started on BiPAP. He underwent CTA of the chest with concern for PE, scan was only positive for some ATX vs less likely PNA. He was admitted to SDU under hospitalist for COPD exacerbation, however, he does not carry a formal diagnosis of this. He is a long time and current every day smoker. .  SUBJECTIVE:   VITAL SIGNS: Temp:  [97.4 F (36.3 C)-98.5 F (36.9 C)] 98.5 F (36.9 C) (01/27 0730) Pulse Rate:  [83-100] 94 (01/27 0730) Resp:  [15-20] 15 (01/27 0730) BP: (109-133)/(68-94) 109/68 mmHg (01/27 0730) SpO2:  [88 %-96 %] 92 % (01/27 0740)  PHYSICAL EXAMINATION: General:   Neuro:  Awake and follows commands. Refuses bipap HEENT:  [place]/AT, PERRL, no JVD noted Cardiovascular:  RRR, no MRG Lungs:  Poor air entry throughout, no obvious wheeze Abdomen:  Obese, soft, non-tender, non-distended Musculoskeletal: No acute deformity or ROM  limitation. Skin:  Grossly intact   Recent Labs Lab 02/14/15 1800 02/15/15 0902 02/17/15 0830  NA 139 139 143  K 4.5 5.1 4.7  CL 97* 98* 96*  CO2 37* 35* 37*  BUN CREATININE 0.94 0.93 0.92  GLUCOSE 136* 117* 96    Recent Labs Lab 02/14/15 1800 02/15/15 0902 02/17/15 0830  HGB 18.0* 18.6* 17.9*  HCT 57.7* 59.4* 56.8*  WBC 5.5 6.5 11.8*  PLT 136* 135* 118*   Dg Chest Port 1 View  02/17/2015  CLINICAL DATA:  Respiratory failure EXAM: PORTABLE CHEST 1 VIEW COMPARISON:  02/14/2015 FINDINGS: Lungs are markedly under aerated. Bibasilar airspace disease has increased. No pneumothorax. Cardiac silhouette is prominent likely due to low volumes. IMPRESSION: Increased bibasilar airspace disease. Electronically Signed   By: Jolaine Click M.D.   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  -Scheduled and PRN bronchodilators: changed duoneb to qid, stopped brovana (redundant). Would recommend Spiriva +/- LABA/ICS for discharge.  convert to this regimen 1/27 -Pred PO and taper over about 10 days.  -Continue  CAP coverage, day 4 of ceftriaxone and azithromycin on 1/27. Would convert to levaquin Po and finish 7 days total.  -Limit sedating meds, dc'd MSO4 -he needs to ambulate, with O2   We will check back on Monday. Call if we can help through the weekend.   Levy Pupa, MD, PhD 02/18/2015, 9:33 AM [facility] [phone] or if no answer [phone]

## 2015-02-18 NOTE — Progress Notes (Signed)
TEAM 1 - Stepdown/ICU TEAM Progress Note  Bryan Gillespie QIO:[id number] DOB: May 22, 1965 DOA: 02/14/2015 PCP: Pcp Not In System  Admit HPI / Brief Narrative: Bryan Gillespie is a 50 y.o. WM PMHx Hypertension; Bowel obstruction (HCC); and Peripheral neuropathy (HCC), Tobacco Abuse.   Presented with worsening shortness of breath over the past 3 days associated generalized weakness He reports today he try to walk over to his neighbor's house and developed acute worsening shortness of breath he was" felt like he is going to die" so he returned and called EMS. He reports that he has presented similar symptoms to outpatient pruritus and was diagnosed with bronchitis and flu. Patient states that he had to be intubated 2 years ago. There is no records in the system regarding this  In emergency department was found to have ABG 7.290/CO2 70.7 and PO2 of 58.0 was transiently on BiPAP Troponin was normal hemoglobin noted to be elevated at 18 PCO2 of 37 CTA of the chest did not show any evidence of PE some evidence of atelectasis but less likely pneumonia  Was discussed with [facility] at this point felt the patient was stable for admission to stepdown  Hospitalist was called for admission for acute respiratory failure  HPI/Subjective: 1/27  A/O 4, NAD.   Assessment/Plan: Acute respiratory failure with hypoxia and hypercarbia (HCC)/COPD exacerbation/CAP  -it is unclear if patient may have some CO2 retention chronically. He denies being on oxygen likely this is an acute change.  -Most likely multifactorial to include COPD Exacerbation /CAPS, CHF? -Continue empiric antibiotics for total 7 days of treatment -DuoNeb QID -Prednisone 40 mg daily -Flutter valve when patient able to tolerate being off of BiPAP -Mucinex DM  Dilated cardiomyopathy (mild)  -See echocardiogram below  -Strict in and out -Daily weight -Obtain PCXR in a.m. -Coreg 3.125 mg BID  Essential hypertension  -Currently BP soft  hold all BP medication  -Given lower extremity edema and dyspnea will obtain echogram   Tobacco abuse - Continue Nicotine Patch. -1/26 counseled patient on seriousness of his illness, explained that if he continued to smoke this would result in his death. Patient stated he no longer was going to smoke. Continue: Code Status as Full     Code Status: FULL Family Communication: no family present at time of exam Disposition Plan: SNF?    Consultants: Rudi Heap [facility]   Procedure/Significant Events: 1/23 CT angiogram PE protocol;- negative PE.-Bibasilar peribronchovascular linear opacities in dependent distribution, likely subsegmental atelectasis.Pneumonia although possible is felt less likely. 1/24 echocardiogram;LVEF= 60% to 65%. -(grade 1 diastolic dysfunction). - Left atrium: mildly dilated. - Right ventricle: mildly to moderately- Right atrium: mildly dilated.    Culture 1/24 sputum culture not obtained 1/24 blood right arm/hand NGTD 1/24 MRSA by PCR negative 1/24 influenza A/B/H1N1 negative 1/24 virus panel pending 1/24 Legionella/strep pneumo urine antigen negative    Antibiotics: Azithromycin 1/24>> 1/27 Ceftriaxone 1/24>> 1/27 Levofloxacin 1/28>>   DVT prophylaxis: Lovenox   Devices    LINES / TUBES:      Continuous Infusions:   Objective: VITAL SIGNS: Temp: 98.9 F (37.2 C) (01/27 1459) Temp Source: Oral (01/27 1459) BP: 121/92 mmHg (01/27 1124) Pulse Rate: 94 (01/27 1459) SPO2; FIO2:   Intake/Output Summary (Last 24 hours) at 02/18/15 1925 Last data filed at 02/18/15 1800  Gross per 24 hour  Intake   1050 ml  Output   2100 ml  Net  -1050 ml     Exam: General: A/O 4,  NAD, positive Acute Respiratory Distress (on high flow nasal cannula) Eyes: Negative headache,negative scleral hemorrhage ENT: Negative Runny nose, negative gingival bleeding, Neck:  Negative scars, masses, torticollis, lymphadenopathy, JVD Lungs: diffuse  poor air movement all lung fields (significantly improved), negative expiratory wheezes, negative crackles Cardiovascular: Regular rhythm and rate without murmur gallop or rub normal S1 and S2 Abdomen:negative abdominal pain, nondistended, positive soft, bowel sounds, no rebound, no ascites, no appreciable mass Extremities: No significant cyanosis, clubbing, or edema bilateral lower extremities Psychiatric:  Negative depression, negative anxiety, negative fatigue, negative mania  Neurologic:  Cranial nerves II through XII intact, tongue/uvula midline, all extremities muscle strength 5/5, sensation intact throughout,  negative dysarthria, negative expressive aphasia, negative receptive aphasia.   Data Reviewed: Basic Metabolic Panel:  Recent Labs Lab 02/14/15 1800 02/15/15 0902 02/17/15 0830  NA 139 139 143  K 4.5 5.1 4.7  CL 97* 98* 96*  CO2 37* 35* 37*  GLUCOSE 136* 117* 96  BUN CREATININE 0.94 0.93 0.92  CALCIUM 8.7* 8.9 8.9  MG  --  2.0 2.3  PHOS  --  4.4  --    Liver Function Tests:  Recent Labs Lab 02/14/15 1800 02/15/15 0902 02/17/15 0830  AST ALT 32 32 29  ALKPHOS 61 71 55  BILITOT 0.7 1.3* 0.7  PROT 6.5 6.7 6.0*  ALBUMIN 3.4* 3.4* 3.1*    Recent Labs Lab 02/14/15 1800  LIPASE 29   No results for input(s): AMMONIA in the last 168 hours. CBC:  Recent Labs Lab 02/14/15 1800 02/15/15 0902 02/17/15 0830  WBC 5.5 6.5 11.8*  NEUTROABS  --   --  9.9*  HGB 18.0* 18.6* 17.9*  HCT 57.7* 59.4* 56.8*  MCV 99.5 101.9* 101.8*  PLT 136* 135* 118*   Cardiac Enzymes:  Recent Labs Lab 02/14/15 1800 02/15/15 0248 02/15/15 0902 02/15/15 1508  TROPONINI <0.03 <0.03 <0.03 <0.03   BNP (last 3 results)  Recent Labs  02/14/15 1800  BNP 28.9    ProBNP (last 3 results) No results for input(s): PROBNP in the last 8760 hours.  CBG: No results for input(s): GLUCAP in the last 168 hours.  Recent Results (from the past 240 hour(s))    Culture, blood (routine x 2) Call MD if unable to obtain prior to antibiotics being given     Status: None (Preliminary result)   Collection Time: 02/15/15  3:38 AM  Result Value Ref Range Status   Specimen Description BLOOD RIGHT ARM  Final   Special Requests AEROBIC BOTTLE ONLY  Final   Culture NO GROWTH 3 DAYS  Final   Report Status PENDING  Incomplete  Culture, blood (routine x 2) Call MD if unable to obtain prior to antibiotics being given     Status: None (Preliminary result)   Collection Time: 02/15/15  4:08 AM  Result Value Ref Range Status   Specimen Description BLOOD RIGHT HAND  Final   Special Requests IN PEDIATRIC BOTTLE  Final   Culture NO GROWTH 3 DAYS  Final   Report Status PENDING  Incomplete  MRSA PCR Screening     Status: None   Collection Time: 02/15/15 10:58 AM  Result Value Ref Range Status   MRSA by PCR NEGATIVE NEGATIVE Final    Comment:        The GeneXpert MRSA Assay (FDA approved for NASAL specimens only), is one component of a comprehensive MRSA colonization surveillance program. It is not intended to  diagnose MRSA infection nor to guide or monitor treatment for MRSA infections.   Respiratory virus panel     Status: None   Collection Time: 02/15/15 10:58 AM  Result Value Ref Range Status   Respiratory Syncytial Virus A Negative Negative Final   Respiratory Syncytial Virus B Negative Negative Final   Influenza A Negative Negative Final   Influenza B Negative Negative Final   Parainfluenza 1 Negative Negative Final   Parainfluenza 2 Negative Negative Final   Parainfluenza 3 Negative Negative Final   Metapneumovirus Negative Negative Final   Rhinovirus Negative Negative Final   Adenovirus Negative Negative Final    Comment: (NOTE) Performed At: [facility] [address] [place], [place] [id number] Mila Homer MD NW:[id number]      Studies:  Recent x-ray studies have been reviewed in detail by the Attending  Physician  Scheduled Meds:  Scheduled Meds: . aspirin EC  81 mg Oral Daily  . carvedilol  3.125 mg Oral BID WC  . dextromethorphan-guaiFENesin  1 tablet Oral BID  . enoxaparin (LOVENOX) injection  40 mg Subcutaneous Daily  . [START ON 02/19/2015] levofloxacin  750 mg Oral Daily  . mometasone-formoterol  2 puff Inhalation BID  . nicotine  21 mg Transdermal Daily  . predniSONE  40 mg Oral Q breakfast  . sodium chloride  3 mL Intravenous Q12H  . tiotropium  18 mcg Inhalation Daily    Time spent on care of this patient: 40 mins   Eliyana Pagliaro, Roselind Messier , MD  [facility] Office  [phone] Pager - [phone]  On-Call/Text Page:      [email or web address]      password [email or web address]  If 7PM-7AM, please contact night-coverage [email or web address] Password [email or web address] 02/18/2015, 7:25 PM   LOS: 4 days   Care during the described time interval was provided by me .  I have reviewed this patient's available data, including medical history, events of note, physical examination, and all test results as part of my evaluation. I have personally reviewed and interpreted all radiology studies.   Carolyne Littles, MD [phone] Pager

## 2015-02-19 ENCOUNTER — Inpatient Hospital Stay (HOSPITAL_COMMUNITY): Payer: Self-pay

## 2015-02-19 DIAGNOSIS — I420 Dilated cardiomyopathy: Secondary | ICD-10-CM

## 2015-02-19 LAB — CBC WITH DIFFERENTIAL/PLATELET
Basophils Absolute: 0 10*3/uL (ref 0.0–0.1)
Basophils Relative: 0 %
EOS ABS: 0 10*3/uL (ref 0.0–0.7)
EOS PCT: 0 %
HCT: 57.7 % — ABNORMAL HIGH (ref 39.0–52.0)
Hemoglobin: 18 g/dL — ABNORMAL HIGH (ref 13.0–17.0)
LYMPHS ABS: 0.5 10*3/uL — AB (ref 0.7–4.0)
LYMPHS PCT: 6 %
MCH: 31.9 pg (ref 26.0–34.0)
MCHC: 31.2 g/dL (ref 30.0–36.0)
MCV: 102.1 fL — AB (ref 78.0–100.0)
MONO ABS: 0.3 10*3/uL (ref 0.1–1.0)
Monocytes Relative: 3 %
Neutro Abs: 7.7 10*3/uL (ref 1.7–7.7)
Neutrophils Relative %: 90 %
PLATELETS: 113 10*3/uL — AB (ref 150–400)
RBC: 5.65 MIL/uL (ref 4.22–5.81)
RDW: 14.7 % (ref 11.5–15.5)
WBC: 8.6 10*3/uL (ref 4.0–10.5)

## 2015-02-19 LAB — COMPREHENSIVE METABOLIC PANEL
ALBUMIN: 3.1 g/dL — AB (ref 3.5–5.0)
ALK PHOS: 53 U/L (ref 38–126)
ALT: 43 U/L (ref 17–63)
AST: 22 U/L (ref 15–41)
Anion gap: 10 (ref 5–15)
BILIRUBIN TOTAL: 0.8 mg/dL (ref 0.3–1.2)
BUN: 11 mg/dL (ref 6–20)
CHLORIDE: 93 mmol/L — AB (ref 101–111)
CO2: 36 mmol/L — ABNORMAL HIGH (ref 22–32)
CREATININE: 0.88 mg/dL (ref 0.61–1.24)
Calcium: 8.9 mg/dL (ref 8.9–10.3)
GFR calc Af Amer: >60 mL/min (ref >60)
GFR calc non Af Amer: >60 mL/min (ref >60)
GLUCOSE: 121 mg/dL — AB (ref 65–99)
POTASSIUM: 4.6 mmol/L (ref 3.5–5.1)
SODIUM: 139 mmol/L (ref 135–145)
TOTAL PROTEIN: 6.2 g/dL — AB (ref 6.5–8.1)

## 2015-02-19 LAB — MAGNESIUM: MAGNESIUM: 2.1 mg/dL (ref 1.7–2.4)

## 2015-02-19 MED ORDER — FUROSEMIDE 10 MG/ML IJ SOLN
40.0000 mg | Freq: Three times a day (TID) | INTRAMUSCULAR | Status: DC
  Administered 2015-02-19 – 2015-02-20 (×4): 40 mg via INTRAVENOUS
  Filled 2015-02-19 (×4): qty 4

## 2015-02-19 NOTE — Progress Notes (Signed)
[place] TEAM 1 - Stepdown/ICU TEAM Progress Note  Bryan Gillespie ZOX:[id number] DOB: [birth year] DOA: 02/14/2015 PCP: Pcp Not In System  Admit HPI / Brief Narrative: Bryan Gillespie is a 50 y.o. WM PMHx Hypertension; Bowel obstruction (HCC); and Peripheral neuropathy (HCC), Tobacco Abuse.   Presented with worsening shortness of breath over the past 3 days associated generalized weakness He reports today he try to walk over to his neighbor's house and developed acute worsening shortness of breath he was" felt like he is going to die" so he returned and called EMS. He reports that he has presented similar symptoms to outpatient pruritus and was diagnosed with bronchitis and flu. Patient states that he had to be intubated 2 years ago. There is no records in the system regarding this  In emergency department was found to have ABG 7.290/CO2 70.7 and PO2 of 58.0 was transiently on BiPAP Troponin was normal hemoglobin noted to be elevated at 18 PCO2 of 37 CTA of the chest did not show any evidence of PE some evidence of atelectasis but less likely pneumonia  Was discussed with [facility] M at this point felt the patient was stable for admission to stepdown  Hospitalist was called for admission for acute respiratory failure  HPI/Subjective: 1/28  A/O 4, NAD. Sitting in bed comfortably  Assessment/Plan: Acute respiratory failure with hypoxia and hypercarbia (HCC)/COPD exacerbation/CAP  -it is unclear if patient may have some CO2 retention chronically. He denies being on oxygen likely this is an acute change.  -Most likely multifactorial to include COPD Exacerbation /CAPS, CHF? -Continue empiric antibiotics for total 7 days of treatment -DuoNeb QID -Prednisone 40 mg daily -Flutter valve -Mucinex DM  Dilated cardiomyopathy (mild)  -See echocardiogram below  -Strict in and out since admission -7.4 L -Daily weight to my: Admission weight= 117.9 kg                1/28 weight= 120.6 kg -1/28 PCXR;  bilateral pleural effusion with pulmonary congestion (my read) -Coreg 3.125 mg BID -Lasix 40 mg TID  Essential hypertension  -See dilated cardiomyopathy   Tobacco abuse - Continue Nicotine Patch. -1/26 counseled patient on seriousness of his illness, explained that if he continued to smoke this would result in his death. Patient stated he no longer was going to smoke. Continue: Code Status as Full     Code Status: FULL Family Communication: no family present at time of exam Disposition Plan: SNF?    Consultants: Rudi Heap [facility] M   Procedure/Significant Events: 1/23 CT angiogram PE protocol;- negative PE.-Bibasilar peribronchovascular linear opacities in dependent distribution, likely subsegmental atelectasis.Pneumonia although possible is felt less likely. 1/24 echocardiogram;LVEF= 60% to 65%. -(grade 1 diastolic dysfunction). - Left atrium: mildly dilated. - Right ventricle: mildly to moderately dilated- Right atrium: mildly dilated.    Culture 1/24 sputum culture not obtained 1/24 blood right arm/hand NGTD 1/24 MRSA by PCR negative 1/24 influenza A/B/H1N1 negative 1/24 virus panel pending 1/24 Legionella/strep pneumo urine antigen negative    Antibiotics: Azithromycin 1/24>> 1/27 Ceftriaxone 1/24>> 1/27 Levofloxacin 1/28>>   DVT prophylaxis: Lovenox   Devices    LINES / TUBES:      Continuous Infusions:   Objective: VITAL SIGNS: Temp: 99.4 F (37.4 C) (01/28 1414) Temp Source: Oral (01/28 1414) BP: 118/88 mmHg (01/28 1414) Pulse Rate: 88 (01/28 1414) SPO2; FIO2:   Intake/Output Summary (Last 24 hours) at 02/19/15 1432 Last data filed at 02/19/15 1300  Gross per 24 hour  Intake   2120  ml  Output   3800 ml  Net  -1680 ml     Exam: General: A/O 4, NAD, positive Acute Respiratory Distress (on high flow nasal cannula) Eyes: Negative headache,negative scleral hemorrhage ENT: Negative Runny nose, negative gingival bleeding, Neck:   Negative scars, masses, torticollis, lymphadenopathy, JVD Lungs: air movement all lung fields,negative expiratory wheezes, positive crackles RLL Cardiovascular: Regular rhythm and rate without murmur gallop or rub normal S1 and S2 Abdomen:negative abdominal pain, nondistended, positive soft, bowel sounds, no rebound, no ascites, no appreciable mass Extremities: No significant cyanosis, clubbing. Plus bilateral lower extremity edema 1+ Psychiatric:  Negative depression, negative anxiety, negative fatigue, negative mania  Neurologic:  Cranial nerves II through XII intact, tongue/uvula midline, all extremities muscle strength 5/5, sensation intact throughout,  negative dysarthria, negative expressive aphasia, negative receptive aphasia.   Data Reviewed: Basic Metabolic Panel:  Recent Labs Lab 02/14/15 1800 02/15/15 0902 02/17/15 0830 02/19/15 0903  NA 139 139 143 139  K 4.5 5.1 4.7 4.6  CL 97* 98* 96* 93*  CO2 37* 35* 37* 36*  GLUCOSE 136* 117* 96 121*  BUN CREATININE 0.94 0.93 0.92 0.88  CALCIUM 8.7* 8.9 8.9 8.9  MG  --  2.0 2.3 2.1  PHOS  --  4.4  --   --    Liver Function Tests:  Recent Labs Lab 02/14/15 1800 02/15/15 0902 02/17/15 0830 02/19/15 0903  AST ALT 32 32 29 43  ALKPHOS 61 71 55 53  BILITOT 0.7 1.3* 0.7 0.8  PROT 6.5 6.7 6.0* 6.2*  ALBUMIN 3.4* 3.4* 3.1* 3.1*    Recent Labs Lab 02/14/15 1800  LIPASE 29   No results for input(s): AMMONIA in the last 168 hours. CBC:  Recent Labs Lab 02/14/15 1800 02/15/15 0902 02/17/15 0830 02/19/15 0903  WBC 5.5 6.5 11.8* 8.6  NEUTROABS  --   --  9.9* 7.7  HGB 18.0* 18.6* 17.9* 18.0*  HCT 57.7* 59.4* 56.8* 57.7*  MCV 99.5 101.9* 101.8* 102.1*  PLT 136* 135* 118* 113*   Cardiac Enzymes:  Recent Labs Lab 02/14/15 1800 02/15/15 0248 02/15/15 0902 02/15/15 1508  TROPONINI <0.03 <0.03 <0.03 <0.03   BNP (last 3 results)  Recent Labs  02/14/15 1800  BNP 28.9    ProBNP  (last 3 results) No results for input(s): PROBNP in the last 8760 hours.  CBG: No results for input(s): GLUCAP in the last 168 hours.  Recent Results (from the past 240 hour(s))  Culture, blood (routine x 2) Call MD if unable to obtain prior to antibiotics being given     Status: None (Preliminary result)   Collection Time: 02/15/15  3:38 AM  Result Value Ref Range Status   Specimen Description BLOOD RIGHT ARM  Final   Special Requests AEROBIC BOTTLE ONLY  Final   Culture NO GROWTH 4 DAYS  Final   Report Status PENDING  Incomplete  Culture, blood (routine x 2) Call MD if unable to obtain prior to antibiotics being given     Status: None (Preliminary result)   Collection Time: 02/15/15  4:08 AM  Result Value Ref Range Status   Specimen Description BLOOD RIGHT HAND  Final   Special Requests IN PEDIATRIC BOTTLE  Final   Culture NO GROWTH 4 DAYS  Final   Report Status PENDING  Incomplete  MRSA PCR Screening     Status: None   Collection Time: 02/15/15 10:58 AM  Result Value Ref  Range Status   MRSA by PCR NEGATIVE NEGATIVE Final    Comment:        The GeneXpert MRSA Assay (FDA approved for NASAL specimens only), is one component of a comprehensive MRSA colonization surveillance program. It is not intended to diagnose MRSA infection nor to guide or monitor treatment for MRSA infections.   Respiratory virus panel     Status: None   Collection Time: 02/15/15 10:58 AM  Result Value Ref Range Status   Respiratory Syncytial Virus A Negative Negative Final   Respiratory Syncytial Virus B Negative Negative Final   Influenza A Negative Negative Final   Influenza B Negative Negative Final   Parainfluenza 1 Negative Negative Final   Parainfluenza 2 Negative Negative Final   Parainfluenza 3 Negative Negative Final   Metapneumovirus Negative Negative Final   Rhinovirus Negative Negative Final   Adenovirus Negative Negative Final    Comment: (NOTE) Performed At: [facility] [address] [place], [place] [id number] Mila Homer MD WU:[id number]      Studies:  Recent x-ray studies have been reviewed in detail by the Attending Physician  Scheduled Meds:  Scheduled Meds: . aspirin EC  81 mg Oral Daily  . carvedilol  3.125 mg Oral BID WC  . dextromethorphan-guaiFENesin  1 tablet Oral BID  . enoxaparin (LOVENOX) injection  40 mg Subcutaneous Daily  . furosemide  40 mg Intravenous TID  . levofloxacin  750 mg Oral Daily  . mometasone-formoterol  2 puff Inhalation BID  . nicotine  21 mg Transdermal Daily  . predniSONE  40 mg Oral Q breakfast  . sodium chloride  3 mL Intravenous Q12H  . tiotropium  18 mcg Inhalation Daily    Time spent on care of this patient: 40 mins   WOODS, Roselind Messier , MD  [facility] Office  [phone] Pager - [phone]  On-Call/Text Page:      [email or web address]      password [email or web address]  If 7PM-7AM, please contact night-coverage [email or web address] Password [email or web address] 02/19/2015, 2:32 PM   LOS: 5 days   Care during the described time interval was provided by me .  I have reviewed this patient's available data, including medical history, events of note, physical examination, and all test results as part of my evaluation. I have personally reviewed and interpreted all radiology studies.   Carolyne Littles, MD [phone] Pager

## 2015-02-20 DIAGNOSIS — J810 Acute pulmonary edema: Secondary | ICD-10-CM

## 2015-02-20 DIAGNOSIS — J811 Chronic pulmonary edema: Secondary | ICD-10-CM | POA: Diagnosis present

## 2015-02-20 LAB — CBC
HCT: 62.5 % — ABNORMAL HIGH (ref 39.0–52.0)
HEMOGLOBIN: 19.4 g/dL — AB (ref 13.0–17.0)
MCH: 31.9 pg (ref 26.0–34.0)
MCHC: 31 g/dL (ref 30.0–36.0)
MCV: 102.8 fL — AB (ref 78.0–100.0)
Platelets: 130 10*3/uL — ABNORMAL LOW (ref 150–400)
RBC: 6.08 MIL/uL — ABNORMAL HIGH (ref 4.22–5.81)
RDW: 14.5 % (ref 11.5–15.5)
WBC: 7.4 10*3/uL (ref 4.0–10.5)

## 2015-02-20 LAB — BASIC METABOLIC PANEL
Anion gap: 14 (ref 5–15)
BUN: 16 mg/dL (ref 6–20)
CALCIUM: 9.3 mg/dL (ref 8.9–10.3)
CO2: 40 mmol/L — ABNORMAL HIGH (ref 22–32)
Chloride: 89 mmol/L — ABNORMAL LOW (ref 101–111)
Creatinine, Ser: 1.01 mg/dL (ref 0.61–1.24)
GFR calc Af Amer: >60 mL/min (ref >60)
GLUCOSE: 81 mg/dL (ref 65–99)
POTASSIUM: 4.3 mmol/L (ref 3.5–5.1)
Sodium: 143 mmol/L (ref 135–145)

## 2015-02-20 LAB — CULTURE, BLOOD (ROUTINE X 2)
CULTURE: NO GROWTH
Culture: NO GROWTH

## 2015-02-20 LAB — MAGNESIUM: Magnesium: 2.4 mg/dL (ref 1.7–2.4)

## 2015-02-20 MED ORDER — FUROSEMIDE 10 MG/ML IJ SOLN
80.0000 mg | Freq: Three times a day (TID) | INTRAMUSCULAR | Status: DC
  Administered 2015-02-21 – 2015-02-23 (×6): 80 mg via INTRAVENOUS
  Filled 2015-02-20 (×8): qty 8

## 2015-02-20 NOTE — Progress Notes (Signed)
[place] TEAM 1 - Stepdown/ICU TEAM Progress Note  Adonay Scheier UJW:[id number] DOB: 01-07-1966 DOA: 02/14/2015 PCP: Pcp Not In System  Admit HPI / Brief Narrative: Marisol Glazer is a 50 y.o. WM PMHx Hypertension; Bowel obstruction (HCC); and Peripheral neuropathy (HCC), Tobacco Abuse.   Presented with worsening shortness of breath over the past 3 days associated generalized weakness He reports today he try to walk over to his neighbor's house and developed acute worsening shortness of breath he was" felt like he is going to die" so he returned and called EMS. He reports that he has presented similar symptoms to outpatient pruritus and was diagnosed with bronchitis and flu. Patient states that he had to be intubated 2 years ago. There is no records in the system regarding this  In emergency department was found to have ABG 7.290/CO2 70.7 and PO2 of 58.0 was transiently on BiPAP Troponin was normal hemoglobin noted to be elevated at 18 PCO2 of 37 CTA of the chest did not show any evidence of PE some evidence of atelectasis but less likely pneumonia  Was discussed with [facility] M at this point felt the patient was stable for admission to stepdown  Hospitalist was called for admission for acute respiratory failure  HPI/Subjective: 1/29  A/O 4, NAD. Sitting in bed comfortably, states ambulated around ward only became slightly lightheaded (found out from RN was not using O2)  Assessment/Plan: Acute respiratory failure with hypoxia and hypercarbia (HCC)/COPD exacerbation/CAP  -it is unclear if patient may have some CO2 retention chronically. He denies being on oxygen likely this is an acute change.  -Most likely multifactorial to include COPD Exacerbation /CAPS, CHF? -Continue empiric antibiotics for total 7 days of treatment -DuoNeb QID -Prednisone 40 mg daily -Flutter valve -Mucinex DM -Remains on HFNC at 8L/min  Dilated cardiomyopathy (mild)  -See echocardiogram below  -Strict in and out  since admission - 12.6 L -Daily weight to my: Admission weight= 117.9 kg                1/29 weight= 120.2 kg (accuracy?) -1/28 PCXR; bilateral pleural effusion with pulmonary congestion (my read) -Coreg 3.125 mg BID -Increase Lasix 80 mg TID; still has 48-72 hours more diuresis?  Essential hypertension  -See dilated cardiomyopathy   Tobacco abuse - Continue Nicotine Patch. -1/26 counseled patient on seriousness of his illness, explained that if he continued to smoke this would result in his death. Patient stated he no longer was going to smoke. Continue: Code Status as Full     Code Status: FULL Family Communication: no family present at time of exam Disposition Plan: SNF?    Consultants: Rudi Heap [facility] M   Procedure/Significant Events: 1/23 CT angiogram PE protocol;- negative PE.-Bibasilar peribronchovascular linear opacities in dependent distribution, likely subsegmental atelectasis.Pneumonia although possible is felt less likely. 1/24 echocardiogram;LVEF= 60% to 65%. -(grade 1 diastolic dysfunction). - Left atrium: mildly dilated. - Right ventricle: mildly to moderately dilated- Right atrium: mildly dilated.    Culture 1/24 sputum culture not obtained 1/24 blood right arm/hand NGTD 1/24 MRSA by PCR negative 1/24 influenza A/B/H1N1 negative 1/24 virus panel pending 1/24 Legionella/strep pneumo urine antigen negative    Antibiotics: Azithromycin 1/24>> 1/27 Ceftriaxone 1/24>> 1/27 Levofloxacin 1/28>>   DVT prophylaxis: Lovenox   Devices    LINES / TUBES:      Continuous Infusions:   Objective: VITAL SIGNS: Temp: 98.6 F (37 C) (01/29 1500) Temp Source: Oral (01/29 1500) BP: 120/77 mmHg (01/29 0717) Pulse Rate: 98 (  01/29 0717) SPO2; FIO2:   Intake/Output Summary (Last 24 hours) at 02/20/15 1910 Last data filed at 02/20/15 1500  Gross per 24 hour  Intake    240 ml  Output   4375 ml  Net  -4135 ml     Exam: General: A/O 4,  NAD, positive Acute Respiratory Distress (on high flow nasal cannula) Eyes: Negative headache,negative scleral hemorrhage ENT: Negative Runny nose, negative gingival bleeding, Neck:  Negative scars, masses, torticollis, lymphadenopathy, JVD Lungs: air movement all lung fields,negative expiratory wheezes, positive crackles RLL Cardiovascular: Regular rhythm and rate without murmur gallop or rub normal S1 and S2 Abdomen:negative abdominal pain, nondistended, positive soft, bowel sounds, no rebound, no ascites, no appreciable mass Extremities: No significant cyanosis, clubbing. Plus bilateral lower extremity edema 1+ Psychiatric:  Negative depression, negative anxiety, negative fatigue, negative mania  Neurologic:  Cranial nerves II through XII intact, tongue/uvula midline, all extremities muscle strength 5/5, sensation intact throughout,  negative dysarthria, negative expressive aphasia, negative receptive aphasia.   Data Reviewed: Basic Metabolic Panel:  Recent Labs Lab 02/14/15 1800 02/15/15 0902 02/17/15 0830 02/19/15 0903 02/20/15 1047  NA 139 139 143 139 143  K 4.5 5.1 4.7 4.6 4.3  CL 97* 98* 96* 93* 89*  CO2 37* 35* 37* 36* 40*  GLUCOSE 136* 117* 96 121* 81  BUN CREATININE 0.94 0.93 0.92 0.88 1.01  CALCIUM 8.7* 8.9 8.9 8.9 9.3  MG  --  2.0 2.3 2.1 2.4  PHOS  --  4.4  --   --   --    Liver Function Tests:  Recent Labs Lab 02/14/15 1800 02/15/15 0902 02/17/15 0830 02/19/15 0903  AST ALT 32 32 29 43  ALKPHOS 61 71 55 53  BILITOT 0.7 1.3* 0.7 0.8  PROT 6.5 6.7 6.0* 6.2*  ALBUMIN 3.4* 3.4* 3.1* 3.1*    Recent Labs Lab 02/14/15 1800  LIPASE 29   No results for input(s): AMMONIA in the last 168 hours. CBC:  Recent Labs Lab 02/14/15 1800 02/15/15 0902 02/17/15 0830 02/19/15 0903 02/20/15 1047  WBC 5.5 6.5 11.8* 8.6 7.4  NEUTROABS  --   --  9.9* 7.7  --   HGB 18.0* 18.6* 17.9* 18.0* 19.4*  HCT 57.7* 59.4* 56.8* 57.7* 62.5*    MCV 99.5 101.9* 101.8* 102.1* 102.8*  PLT 136* 135* 118* 113* 130*   Cardiac Enzymes:  Recent Labs Lab 02/14/15 1800 02/15/15 0248 02/15/15 0902 02/15/15 1508  TROPONINI <0.03 <0.03 <0.03 <0.03   BNP (last 3 results)  Recent Labs  02/14/15 1800  BNP 28.9    ProBNP (last 3 results) No results for input(s): PROBNP in the last 8760 hours.  CBG: No results for input(s): GLUCAP in the last 168 hours.  Recent Results (from the past 240 hour(s))  Culture, blood (routine x 2) Call MD if unable to obtain prior to antibiotics being given     Status: None   Collection Time: 02/15/15  3:38 AM  Result Value Ref Range Status   Specimen Description BLOOD RIGHT ARM  Final   Special Requests AEROBIC BOTTLE ONLY  Final   Culture NO GROWTH 5 DAYS  Final   Report Status 02/20/2015 FINAL  Final  Culture, blood (routine x 2) Call MD if unable to obtain prior to antibiotics being given     Status: None   Collection Time: 02/15/15  4:08 AM  Result Value Ref Range Status   Specimen  Description BLOOD RIGHT HAND  Final   Special Requests IN PEDIATRIC BOTTLE  Final   Culture NO GROWTH 5 DAYS  Final   Report Status 02/20/2015 FINAL  Final  MRSA PCR Screening     Status: None   Collection Time: 02/15/15 10:58 AM  Result Value Ref Range Status   MRSA by PCR NEGATIVE NEGATIVE Final    Comment:        The GeneXpert MRSA Assay (FDA approved for NASAL specimens only), is one component of a comprehensive MRSA colonization surveillance program. It is not intended to diagnose MRSA infection nor to guide or monitor treatment for MRSA infections.   Respiratory virus panel     Status: None   Collection Time: 02/15/15 10:58 AM  Result Value Ref Range Status   Respiratory Syncytial Virus A Negative Negative Final   Respiratory Syncytial Virus B Negative Negative Final   Influenza A Negative Negative Final   Influenza B Negative Negative Final   Parainfluenza 1 Negative Negative Final    Parainfluenza 2 Negative Negative Final   Parainfluenza 3 Negative Negative Final   Metapneumovirus Negative Negative Final   Rhinovirus Negative Negative Final   Adenovirus Negative Negative Final    Comment: (NOTE) Performed At: [facility] [address] [place], [place] [id number] Mila Homer MD OZ:[id number]      Studies:  Recent x-ray studies have been reviewed in detail by the Attending Physician  Scheduled Meds:  Scheduled Meds: . aspirin EC  81 mg Oral Daily  . carvedilol  3.125 mg Oral BID WC  . dextromethorphan-guaiFENesin  1 tablet Oral BID  . enoxaparin (LOVENOX) injection  40 mg Subcutaneous Daily  . furosemide  80 mg Intravenous TID  . mometasone-formoterol  2 puff Inhalation BID  . nicotine  21 mg Transdermal Daily  . predniSONE  40 mg Oral Q breakfast  . sodium chloride  3 mL Intravenous Q12H  . tiotropium  18 mcg Inhalation Daily    Time spent on care of this patient: 40 mins   Khrystyna Schwalm, Roselind Messier , MD  [facility] Office  [phone] Pager - [phone]  On-Call/Text Page:      [email or web address]      password [email or web address]  If 7PM-7AM, please contact night-coverage [email or web address] Password [email or web address] 02/20/2015, 7:10 PM   LOS: 6 days   Care during the described time interval was provided by me .  I have reviewed this patient's available data, including medical history, events of note, physical examination, and all test results as part of my evaluation. I have personally reviewed and interpreted all radiology studies.   Carolyne Littles, MD [phone] Pager

## 2015-02-21 ENCOUNTER — Encounter (HOSPITAL_COMMUNITY): Payer: Self-pay

## 2015-02-21 DIAGNOSIS — J9622 Acute and chronic respiratory failure with hypercapnia: Secondary | ICD-10-CM

## 2015-02-21 DIAGNOSIS — J441 Chronic obstructive pulmonary disease with (acute) exacerbation: Principal | ICD-10-CM

## 2015-02-21 DIAGNOSIS — J9621 Acute and chronic respiratory failure with hypoxia: Secondary | ICD-10-CM

## 2015-02-21 LAB — IRON AND TIBC
Iron: 350 ug/dL — ABNORMAL HIGH (ref 45–182)
Saturation Ratios: 88 % — ABNORMAL HIGH (ref 17.9–39.5)
TIBC: 398 ug/dL (ref 250–450)
UIBC: 48 ug/dL

## 2015-02-21 LAB — RETICULOCYTES
RBC.: 6.31 MIL/uL — AB (ref 4.22–5.81)
RETIC COUNT ABSOLUTE: 31.6 10*3/uL (ref 19.0–186.0)
RETIC CT PCT: 0.5 % (ref 0.4–3.1)

## 2015-02-21 LAB — FOLATE: FOLATE: 16 ng/mL (ref >5.9)

## 2015-02-21 LAB — VITAMIN B12: VITAMIN B 12: 354 pg/mL (ref 180–914)

## 2015-02-21 LAB — FERRITIN: Ferritin: 109 ng/mL (ref 24–336)

## 2015-02-21 MED ORDER — PREDNISONE 20 MG PO TABS
20.0000 mg | ORAL_TABLET | Freq: Every day | ORAL | Status: DC
  Administered 2015-02-22 – 2015-02-23 (×2): 20 mg via ORAL
  Filled 2015-02-21 (×2): qty 1

## 2015-02-21 NOTE — Progress Notes (Signed)
[place] TEAM 1 - Stepdown/ICU TEAM PROGRESS NOTE  Bryan Gillespie ZOX:[id number] DOB: Jun 10, 1965 DOA: 02/14/2015 PCP: Pcp Not In System  Admit HPI / Brief Narrative: 49 y.o.M Hx Hypertension; Bowel obstruction; Peripheral neuropathy, and Tobacco Abuse who presented with worsening shortness of breath over 3 days associated w/ generalized weakness.  Patient stated he had to be intubated 2 years ago for similar sx.   In emergency department was found to have ABG 7.290/CO2 70.7 and PO2 of 58.0.  Troponin was normal - hemoglobin elevated at 18 - PCO2 of 37 - CTA of the chest did not show any evidence of PE.  HPI/Subjective: The patient is in good spirits resting in bed.  He has completed his entire breakfast.  He denies any chest pain, nausea vomiting, abdominal pain.  He does not feel short of breath but is requiring high-level oxygen support.  Assessment/Plan:  Acute respiratory failure with hypoxia and hypercarbia / ?COPD  The patient's severe hypoxia remains somewhat of a mystery - CT of the chest did not reveal severe parenchymal findings suggestive of advanced COPD - transcranial Doppler was negative for evidence of a right to left shunt - Pulmonary has ordered PFTs - keeping neuromuscular disease in the differential as well  Essential hypertension  Blood pressure currently well controlled  Tobacco abuse I have advised the patient that he must absolutely discontinue smoking completely and permanently to include marijuana - I have explained to him that if he continues to smoke he will die and that it will likely occur within the next year - I have also advised him that he should avoid any exposure to secondhand smoke  Polycythemia Due to above - follow  Macrocytosis Check B12 and folic acid levels  Code Status: FULL Family Communication: No family present at time of exam today Disposition Plan: SDU  Consultants: PCCM  Procedures: TTE - 1/24 - EF 60-65 percent - grade 1 diastolic  dysfunction - no significant valvular abnormalities - mild/moderately dilated RV Transcranial Doppler - 1/26 - no evidence of right to left intracardiac or pulmonary shunt  Antibiotics: Azithromycin 1/24 > 1/26 Ceftriaxone 1/24 > 1/26 Levaquin 1/28 >  DVT prophylaxis: lovenox   Objective: Blood pressure 125/84, pulse 83, temperature 97.4 F (36.3 C), temperature source Oral, resp. rate 19, height  (1.803 m), weight 119.6 kg (263 lb 10.7 oz), SpO2 85 %.  Intake/Output Summary (Last 24 hours) at 02/21/15 1031 Last data filed at 02/21/15 0500  Gross per 24 hour  Intake    480 ml  Output   3000 ml  Net  -2520 ml   Exam: General: Alert and conversant in no apparent resp distress  Lungs: Very poor air movement throughout all fields - no wheeze or crackles  Cardiovascular: distant heart sounds - RRR Abdomen: Nontender, nondistended, soft, bowel sounds positive, no rebound, no ascites, no appreciable mass Extremities: No significant cyanosis, or clubbing;  1+ edema bilateral lower extremities  Data Reviewed:  Basic Metabolic Panel:  Recent Labs Lab 02/14/15 1800 02/15/15 0902 02/17/15 0830 02/19/15 0903 02/20/15 1047  NA 139 139 143 139 143  K 4.5 5.1 4.7 4.6 4.3  CL 97* 98* 96* 93* 89*  CO2 37* 35* 37* 36* 40*  GLUCOSE 136* 117* 96 121* 81  BUN CREATININE 0.94 0.93 0.92 0.88 1.01  CALCIUM 8.7* 8.9 8.9 8.9 9.3  MG  --  2.0 2.3 2.1 2.4  PHOS  --  4.4  --   --   --  CBC:  Recent Labs Lab 02/14/15 1800 02/15/15 0902 02/17/15 0830 02/19/15 0903 02/20/15 1047  WBC 5.5 6.5 11.8* 8.6 7.4  NEUTROABS  --   --  9.9* 7.7  --   HGB 18.0* 18.6* 17.9* 18.0* 19.4*  HCT 57.7* 59.4* 56.8* 57.7* 62.5*  MCV 99.5 101.9* 101.8* 102.1* 102.8*  PLT 136* 135* 118* 113* 130*    Liver Function Tests:  Recent Labs Lab 02/14/15 1800 02/15/15 0902 02/17/15 0830 02/19/15 0903  AST ALT 32 32 29 43  ALKPHOS 61 71 55 53  BILITOT 0.7 1.3* 0.7  0.8  PROT 6.5 6.7 6.0* 6.2*  ALBUMIN 3.4* 3.4* 3.1* 3.1*    Recent Labs Lab 02/14/15 1800  LIPASE 29   Coags:  Recent Labs Lab 02/15/15 0902  INR 1.18   Cardiac Enzymes:  Recent Labs Lab 02/14/15 1800 02/15/15 0248 02/15/15 0902 02/15/15 1508  TROPONINI <0.03 <0.03 <0.03 <0.03    Recent Results (from the past 240 hour(s))  Culture, blood (routine x 2) Call MD if unable to obtain prior to antibiotics being given     Status: None   Collection Time: 02/15/15  3:38 AM  Result Value Ref Range Status   Specimen Description BLOOD RIGHT ARM  Final   Special Requests AEROBIC BOTTLE ONLY  Final   Culture NO GROWTH 5 DAYS  Final   Report Status 02/20/2015 FINAL  Final  Culture, blood (routine x 2) Call MD if unable to obtain prior to antibiotics being given     Status: None   Collection Time: 02/15/15  4:08 AM  Result Value Ref Range Status   Specimen Description BLOOD RIGHT HAND  Final   Special Requests IN PEDIATRIC BOTTLE  Final   Culture NO GROWTH 5 DAYS  Final   Report Status 02/20/2015 FINAL  Final  MRSA PCR Screening     Status: None   Collection Time: 02/15/15 10:58 AM  Result Value Ref Range Status   MRSA by PCR NEGATIVE NEGATIVE Final    Comment:        The GeneXpert MRSA Assay (FDA approved for NASAL specimens only), is one component of a comprehensive MRSA colonization surveillance program. It is not intended to diagnose MRSA infection nor to guide or monitor treatment for MRSA infections.   Respiratory virus panel     Status: None   Collection Time: 02/15/15 10:58 AM  Result Value Ref Range Status   Respiratory Syncytial Virus A Negative Negative Final   Respiratory Syncytial Virus B Negative Negative Final   Influenza A Negative Negative Final   Influenza B Negative Negative Final   Parainfluenza 1 Negative Negative Final   Parainfluenza 2 Negative Negative Final   Parainfluenza 3 Negative Negative Final   Metapneumovirus Negative  Negative Final   Rhinovirus Negative Negative Final   Adenovirus Negative Negative Final    Comment: (NOTE) Performed At: [facility] [address] [place], [place] [id number] Mila Homer MD WU:[id number]      Studies:   Recent x-ray studies have been reviewed in detail by the Attending Physician  Scheduled Meds:  Scheduled Meds: . aspirin EC  81 mg Oral Daily  . carvedilol  3.125 mg Oral BID WC  . dextromethorphan-guaiFENesin  1 tablet Oral BID  . enoxaparin (LOVENOX) injection  40 mg Subcutaneous Daily  . furosemide  80 mg Intravenous TID  . mometasone-formoterol  2 puff Inhalation BID  . nicotine  21 mg Transdermal Daily  .  predniSONE  40 mg Oral Q breakfast  . sodium chloride  3 mL Intravenous Q12H  . tiotropium  18 mcg Inhalation Daily    Time spent on care of this patient: 35 mins   Advika Mclelland T , MD   [facility] Office  [phone] Pager - Text Page per Loretha Stapler as per below:  On-Call/Text Page:      [email or web address]      password [email or web address]  If 7PM-7AM, please contact night-coverage [email or web address] Password [email or web address] 02/21/2015, 10:31 AM   LOS: 7 days

## 2015-02-21 NOTE — Progress Notes (Signed)
PCCM PROGRESS NOTE  ADMISSION DATE: 02/14/2015 CONSULT DATE: 02/15/2015 REFERRING PROVIDER: [facility]  CC: Short of breath  SUBJECTIVE: Feels inhalers help.  He doesn't feel BiPAP helps.  He denies chest pain.  Cough better.  Reports weakness, but seems more like he is describing feeling tired.  VITAL SIGNS: BP 125/84 mmHg  Pulse 83  Temp(Src) 97.4 F (36.3 C) (Oral)  Resp 19  Ht  (1.803 m)  Wt 263 lb 10.7 oz (119.6 kg)  BMI 36.79 kg/m2  SpO2 85%  INTAKE/OUTPUT: I/O last 3 completed shifts: In: 720 [P.O.:720] Out: 6375 [Urine:6375]  General: alert HEENT: no sinus tenderness Cardiac: regular, no murmur Chest: basilar crackles, no wheeze Abd: soft, non tender Ext: no edema Neuro: 4/5 strength Rt arm, otherwise 5/5, CN intact Skin: no rashes   CBC Recent Labs     02/19/15  0903  02/20/15  1047  WBC  8.6  7.4  HGB  18.0*  19.4*  HCT  57.7*  62.5*  PLT  113*  130*    BMET Recent Labs     02/19/15  0903  02/20/15  1047  NA  139  143  K  4.6  4.3  CL  93*  89*  CO2  36*  40*  BUN  11  16  CREATININE  0.88  1.01  GLUCOSE  121*  81    Electrolytes Recent Labs     02/19/15  0903  02/20/15  1047  CALCIUM  8.9  9.3  MG  2.1  2.4    ABG    Component Value Date/Time   PHART 7.372 02/16/2015 1205   PCO2ART 59.3* 02/16/2015 1205   PO2ART 63.3* 02/16/2015 1205   HCO3 33.6* 02/16/2015 1205   TCO2 35.4 02/16/2015 1205   O2SAT 89.9 02/16/2015 1205    Liver Enzymes Recent Labs     02/19/15  0903  AST  22  ALT  43  ALKPHOS  53  BILITOT  0.8  ALBUMIN  3.1*    Imaging No results found.   ANTIBIOTICS: 1/23 Rocephin >> 1/26 1/23 Zithromax >> 1/26 1/28 Levaquin >>  STUDIES: 1/23 CT chest >> ATX 1/24 Echo >> EF 60 to 65%, grade 1 diastolic dysfx 1/26 Transcranial doppler >> negative for shunt  EVENTS: 1/23 Admit  DISCUSSION: 50 yo male smoker presented with generalized weakness, swelling of legs/lips, and dyspnea associated with  cough.  He was found to have acute on chronic hypoxic/hypercapnic respiratory failure.  ASSESSMENT/PLAN:  Acute on chronic hypoxic/hypercapnic respiratory failure >> not clear this is solely from COPD and atelectasis. - oxygen to keep SpO2 > 90% - d/c BiPAP >> pt not tolerant of this - bronchial hygiene  AECOPD. - continue spiriva, dulera - prn albuterol - wean off prednisone as tolerated - check PFT's  Tobacco abuse. - nicotine patch  HTN. - per primary team  If PFT's unrevealing, then consider neurology consult to determine if he has neurologic process contributing to hypoventilation/hypoxia.  D/w Dr. Sharon Seller.  Coralyn Helling, MD [facility] Pulmonary/Critical Care 02/21/2015, 10:03 AM Pager:  [phone] After 3pm call: [phone]

## 2015-02-21 NOTE — Progress Notes (Signed)
Financial counselor, Madelaine Bhat will be up to see patient today.

## 2015-02-22 ENCOUNTER — Inpatient Hospital Stay (HOSPITAL_COMMUNITY): Payer: Self-pay

## 2015-02-22 DIAGNOSIS — D751 Secondary polycythemia: Secondary | ICD-10-CM | POA: Insufficient documentation

## 2015-02-22 DIAGNOSIS — J988 Other specified respiratory disorders: Secondary | ICD-10-CM

## 2015-02-22 DIAGNOSIS — G709 Myoneural disorder, unspecified: Secondary | ICD-10-CM | POA: Insufficient documentation

## 2015-02-22 DIAGNOSIS — J99 Respiratory disorders in diseases classified elsewhere: Secondary | ICD-10-CM | POA: Insufficient documentation

## 2015-02-22 LAB — PULMONARY FUNCTION TEST
DL/VA % pred: 120 %
DL/VA: 5.58 ml/min/mmHg/L
DLCO COR % PRED: 49 %
DLCO COR: 16.13 ml/min/mmHg
DLCO UNC % PRED: 55 %
DLCO UNC: 18.08 ml/min/mmHg
FEF 25-75 POST: 1.81 L/s
FEF 25-75 PRE: 1.38 L/s
FEF2575-%Change-Post: 30 %
FEF2575-%PRED-PRE: 39 %
FEF2575-%Pred-Post: 51 %
FEV1-%Change-Post: 6 %
FEV1-%PRED-POST: 40 %
FEV1-%PRED-PRE: 37 %
FEV1-POST: 1.59 L
FEV1-Pre: 1.5 L
FEV1FVC-%CHANGE-POST: 5 %
FEV1FVC-%Pred-Pre: 100 %
FEV6-%CHANGE-POST: 0 %
FEV6-%PRED-POST: 39 %
FEV6-%PRED-PRE: 38 %
FEV6-POST: 1.92 L
FEV6-Pre: 1.9 L
FEV6FVC-%PRED-POST: 103 %
FEV6FVC-%Pred-Pre: 103 %
FVC-%Change-Post: 0 %
FVC-%Pred-Post: 37 %
FVC-%Pred-Pre: 37 %
FVC-Post: 1.92 L
FVC-Pre: 1.9 L
POST FEV6/FVC RATIO: 100 %
PRE FEV1/FVC RATIO: 79 %
Post FEV1/FVC ratio: 83 %
Pre FEV6/FVC Ratio: 100 %
RV % pred: 86 %
RV: 1.76 L
TLC % PRED: 54 %
TLC: 3.78 L

## 2015-02-22 LAB — CBC
HEMATOCRIT: 61.8 % — AB (ref 39.0–52.0)
Hemoglobin: 19.8 g/dL — ABNORMAL HIGH (ref 13.0–17.0)
MCH: 31.6 pg (ref 26.0–34.0)
MCHC: 32 g/dL (ref 30.0–36.0)
MCV: 98.6 fL (ref 78.0–100.0)
PLATELETS: 139 10*3/uL — AB (ref 150–400)
RBC: 6.27 MIL/uL — AB (ref 4.22–5.81)
RDW: 14.2 % (ref 11.5–15.5)
WBC: 9.4 10*3/uL (ref 4.0–10.5)

## 2015-02-22 MED ORDER — ALBUTEROL SULFATE (2.5 MG/3ML) 0.083% IN NEBU
2.5000 mg | INHALATION_SOLUTION | Freq: Once | RESPIRATORY_TRACT | Status: AC
  Administered 2015-02-22: 2.5 mg via RESPIRATORY_TRACT

## 2015-02-22 NOTE — Progress Notes (Signed)
Pt's oxygen saturation declined to low 80's on HFNC at 8L. Oxygen was increased to 10 L with no improvement. Pt was placed on a venti mask and is sating currently upper 80s to low 90s.

## 2015-02-22 NOTE — Progress Notes (Signed)
[place] TEAM 1 - Stepdown/ICU TEAM Progress Note  Bryan Gillespie ZOX:[id number] DOB: 1965-06-27 DOA: 02/14/2015 PCP: Pcp Not In System  Admit HPI / Brief Narrative: Bryan Gillespie is a 50 y.o. WM PMHx Hypertension; Bowel obstruction (HCC); and Peripheral neuropathy (HCC), Tobacco Abuse.   Presented with worsening shortness of breath over the past 3 days associated generalized weakness He reports today he try to walk over to his neighbor's house and developed acute worsening shortness of breath he was" felt like he is going to die" so he returned and called EMS. He reports that he has presented similar symptoms to outpatient pruritus and was diagnosed with bronchitis and flu. Patient states that he had to be intubated 2 years ago. There is no records in the system regarding this  In emergency department was found to have ABG 7.290/CO2 70.7 and PO2 of 58.0 was transiently on BiPAP Troponin was normal hemoglobin noted to be elevated at 18 PCO2 of 37 CTA of the chest did not show any evidence of PE some evidence of atelectasis but less likely pneumonia  Was discussed with [facility] M at this point felt the patient was stable for admission to stepdown  Hospitalist was called for admission for acute respiratory failure  HPI/Subjective: 1/31  A/O 4, NAD. Sitting in bed comfortably, states ambulated around ward on O2 positive DOE    Assessment/Plan: Acute respiratory failure with hypoxia and hypercarbia (HCC)/COPD exacerbation/CAP  -it is unclear if patient may have some CO2 retention chronically. He denies being on oxygen likely this is an acute change.  -Most likely multifactorial to include COPD Exacerbation /CAPS, CHF? -Continue empiric antibiotics for total 7 days of treatment -DuoNeb QID -Prednisone 40 mg daily -Flutter valve -Mucinex DM -Remains on HFNC at 8L/min -PFTs more consistent with restrictive lung disease [facility] M recommends Neurology assessment; Neuromuscular Respiratory muscle  weakness Why?  Dilated cardiomyopathy (mild)  -See echocardiogram below  -Strict in and out since admission - 18.9 L -Daily weight to my: Admission weight= 117.9 kg                1/31 weight= 115.5 kg  -1/28 PCXR; bilateral pleural effusion with pulmonary congestion (my read) -Coreg 3.125 mg BID -Continue Lasix 80 mg TID;  Essential hypertension  -See dilated cardiomyopathy   Tobacco abuse - Continue Nicotine Patch. -1/26 counseled patient on seriousness of his illness, explained that if he continued to smoke this would result in his death. Patient stated he no longer was going to smoke. Continue: Code Status as Full   Polycythemia secondary? -Would suspect that H/H was start to decrease as patients respiratory status improved. But H/H continues to increase -Consult hematology oncology in A.m.      Code Status: FULL Family Communication: no family present at time of exam Disposition Plan: SNF?    Consultants: Rudi Heap [facility] M   Procedure/Significant Events: 1/23 CT angiogram PE protocol;- negative PE.-Bibasilar peribronchovascular linear opacities in dependent distribution, likely subsegmental atelectasis.Pneumonia although possible is felt less likely. 1/24 echocardiogram;LVEF= 60% to 65%. -(grade 1 diastolic dysfunction). - Left atrium: mildly dilated. - Right ventricle: mildly to moderately dilated- Right atrium: mildly dilated.    Culture 1/24 sputum culture not obtained 1/24 blood right arm/hand NGTD 1/24 MRSA by PCR negative 1/24 influenza A/B/H1N1 negative 1/24 virus panel pending 1/24 Legionella/strep pneumo urine antigen negative    Antibiotics: Azithromycin 1/24>> 1/27 Ceftriaxone 1/24>> 1/27 Levofloxacin 1/28>>    DVT prophylaxis: Lovenox   Devices    LINES /  TUBES:      Continuous Infusions:   Objective: VITAL SIGNS: Temp: 98.7 F (37.1 C) (01/31 1940) Temp Source: Oral (01/31 1940) BP: 133/91 mmHg (01/31 1622) Pulse  Rate: 100 (01/31 1622) SPO2; FIO2:   Intake/Output Summary (Last 24 hours) at 02/22/15 2115 Last data filed at 02/22/15 1942  Gross per 24 hour  Intake    240 ml  Output   2550 ml  Net  -2310 ml     Exam: General: A/O 4, NAD, positive Acute Respiratory Distress (on high flow nasal cannula) Eyes: Negative headache,negative scleral hemorrhage ENT: Negative Runny nose, negative gingival bleeding, Neck:  Negative scars, masses, torticollis, lymphadenopathy, JVD Lungs: air movement all lung fields,negative expiratory wheezes, positive crackles RLL Cardiovascular: Regular rhythm and rate without murmur gallop or rub normal S1 and S2 Abdomen:negative abdominal pain, nondistended, positive soft, bowel sounds, no rebound, no ascites, no appreciable mass Extremities: No significant cyanosis, clubbing. Plus bilateral lower extremity edema 1+ Psychiatric:  Negative depression, negative anxiety, negative fatigue, negative mania  Neurologic:  Cranial nerves II through XII intact, tongue/uvula midline, all extremities muscle strength 5/5, sensation intact throughout,  negative dysarthria, negative expressive aphasia, negative receptive aphasia.   Data Reviewed: Basic Metabolic Panel:  Recent Labs Lab 02/17/15 0830 02/19/15 0903 02/20/15 1047  NA 143 139 143  K 4.7 4.6 4.3  CL 96* 93* 89*  CO2 37* 36* 40*  GLUCOSE 96 121* 81  BUN CREATININE 0.92 0.88 1.01  CALCIUM 8.9 8.9 9.3  MG 2.3 2.1 2.4   Liver Function Tests:  Recent Labs Lab 02/17/15 0830 02/19/15 0903  AST 18 22  ALT 29 43  ALKPHOS 55 53  BILITOT 0.7 0.8  PROT 6.0* 6.2*  ALBUMIN 3.1* 3.1*   No results for input(s): LIPASE, AMYLASE in the last 168 hours. No results for input(s): AMMONIA in the last 168 hours. CBC:  Recent Labs Lab 02/17/15 0830 02/19/15 0903 02/20/15 1047 02/22/15 0429  WBC 11.8* 8.6 7.4 9.4  NEUTROABS 9.9* 7.7  --   --   HGB 17.9* 18.0* 19.4* 19.8*  HCT 56.8* 57.7* 62.5* 61.8*   MCV 101.8* 102.1* 102.8* 98.6  PLT 118* 113* 130* 139*   Cardiac Enzymes: No results for input(s): CKTOTAL, CKMB, CKMBINDEX, TROPONINI in the last 168 hours. BNP (last 3 results)  Recent Labs  02/14/15 1800  BNP 28.9    ProBNP (last 3 results) No results for input(s): PROBNP in the last 8760 hours.  CBG: No results for input(s): GLUCAP in the last 168 hours.  Recent Results (from the past 240 hour(s))  Culture, blood (routine x 2) Call MD if unable to obtain prior to antibiotics being given     Status: None   Collection Time: 02/15/15  3:38 AM  Result Value Ref Range Status   Specimen Description BLOOD RIGHT ARM  Final   Special Requests AEROBIC BOTTLE ONLY  Final   Culture NO GROWTH 5 DAYS  Final   Report Status 02/20/2015 FINAL  Final  Culture, blood (routine x 2) Call MD if unable to obtain prior to antibiotics being given     Status: None   Collection Time: 02/15/15  4:08 AM  Result Value Ref Range Status   Specimen Description BLOOD RIGHT HAND  Final   Special Requests IN PEDIATRIC BOTTLE  Final   Culture NO GROWTH 5 DAYS  Final   Report Status 02/20/2015 FINAL  Final  MRSA PCR Screening  Status: None   Collection Time: 02/15/15 10:58 AM  Result Value Ref Range Status   MRSA by PCR NEGATIVE NEGATIVE Final    Comment:        The GeneXpert MRSA Assay (FDA approved for NASAL specimens only), is one component of a comprehensive MRSA colonization surveillance program. It is not intended to diagnose MRSA infection nor to guide or monitor treatment for MRSA infections.   Respiratory virus panel     Status: None   Collection Time: 02/15/15 10:58 AM  Result Value Ref Range Status   Respiratory Syncytial Virus A Negative Negative Final   Respiratory Syncytial Virus B Negative Negative Final   Influenza A Negative Negative Final   Influenza B Negative Negative Final   Parainfluenza 1 Negative Negative Final   Parainfluenza 2 Negative Negative Final    Parainfluenza 3 Negative Negative Final   Metapneumovirus Negative Negative Final   Rhinovirus Negative Negative Final   Adenovirus Negative Negative Final    Comment: (NOTE) Performed At: [facility] [address] [place], [place] [id number] Mila Homer MD WU:[id number]      Studies:  Recent x-ray studies have been reviewed in detail by the Attending Physician  Scheduled Meds:  Scheduled Meds: . aspirin EC  81 mg Oral Daily  . carvedilol  3.125 mg Oral BID WC  . dextromethorphan-guaiFENesin  1 tablet Oral BID  . enoxaparin (LOVENOX) injection  40 mg Subcutaneous Daily  . furosemide  80 mg Intravenous TID  . mometasone-formoterol  2 puff Inhalation BID  . nicotine  21 mg Transdermal Daily  . predniSONE  20 mg Oral Q breakfast  . sodium chloride  3 mL Intravenous Q12H  . tiotropium  18 mcg Inhalation Daily    Time spent on care of this patient: 40 mins   WOODS, Roselind Messier , MD  [facility] Office  [phone] Pager - [phone]  On-Call/Text Page:      [email or web address]      password [email or web address]  If 7PM-7AM, please contact night-coverage [email or web address] Password [email or web address] 02/22/2015, 9:15 PM   LOS: 8 days   Care during the described time interval was provided by me .  I have reviewed this patient's available data, including medical history, events of note, physical examination, and all test results as part of my evaluation. I have personally reviewed and interpreted all radiology studies.   Carolyne Littles, MD [phone] Pager

## 2015-02-22 NOTE — Progress Notes (Signed)
Pt placed on non-rebreather and oxygent stats are currently 90-92%.

## 2015-02-22 NOTE — Progress Notes (Signed)
PCCM PROGRESS NOTE  ADMISSION DATE: 02/14/2015 CONSULT DATE: 02/15/2015 REFERRING PROVIDER: [facility]  CC: Short of breath  SUBJECTIVE: Breathing okay.  Denies chest pain.  VITAL SIGNS: BP 121/80 mmHg  Pulse 87  Temp(Src) 98.2 F (36.8 C) (Oral)  Resp 20  Ht  (1.803 m)  Wt 254 lb 10.1 oz (115.5 kg)  BMI 35.53 kg/m2  SpO2 93%  INTAKE/OUTPUT: I/O last 3 completed shifts: In: 840 [P.O.:840] Out: 6150 [Urine:6150]  General: alert HEENT: no sinus tenderness Cardiac: regular, no murmur Chest: no wheeze Abd: soft, non tender Ext: no edema Neuro: 4/5 strength Rt arm, otherwise 5/5, CN intact Skin: no rashes   CBC Recent Labs     02/20/15  1047  02/22/15  0429  WBC  7.4  9.4  HGB  19.4*  19.8*  HCT  62.5*  61.8*  PLT  130*  139*    BMET Recent Labs     02/20/15  1047  NA  143  K  4.3  CL  89*  CO2  40*  BUN  16  CREATININE  1.01  GLUCOSE  81    Electrolytes Recent Labs     02/20/15  1047  CALCIUM  9.3  MG  2.4    ABG    Component Value Date/Time   PHART 7.372 02/16/2015 1205   PCO2ART 59.3* 02/16/2015 1205   PO2ART 63.3* 02/16/2015 1205   HCO3 33.6* 02/16/2015 1205   TCO2 35.4 02/16/2015 1205   O2SAT 89.9 02/16/2015 1205    Liver Enzymes No results for input(s): AST, ALT, ALKPHOS, BILITOT, ALBUMIN in the last 72 hours.  Imaging No results found.   ANTIBIOTICS: 1/23 Rocephin >> 1/26 1/23 Zithromax >> 1/26 1/28 Levaquin >>  STUDIES: 1/23 CT chest >> ATX 1/24 Echo >> EF 60 to 65%, grade 1 diastolic dysfx 1/26 Transcranial doppler >> negative for shunt 1/31 PFT >> FEV1 1.59 (40%), FEV1% 83, TLC 3.78 (54%), DLCO 55%, no BD  EVENTS: 1/23 Admit  DISCUSSION: 50 yo male smoker presented with generalized weakness, swelling of legs/lips, and dyspnea associated with cough.  He was found to have acute on chronic hypoxic/hypercapnic respiratory failure.  His PFTs are more consistent with restrictive lung  disease.  ASSESSMENT/PLAN:  Acute on chronic hypoxic/hypercapnic respiratory failure >> not clear this is solely from COPD and atelectasis.  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 - oxygen to keep SpO2 > 90% - might need neurology assessment to further determine if he has neuromuscular disorder that could be causing respiratory muscle weakness - bronchial hygiene  AECOPD. - continue spiriva, dulera - prn albuterol - wean off prednisone as tolerated  Tobacco abuse. - nicotine patch  HTN. - per primary team  Polycythemia >> initial assessment was polycythemia related to hypoxia, but has not improved with supplemental oxygen therapy. Thrombocytopenia. Plan: - might need hematology assessment   Coralyn Helling, MD [facility] Pulmonary/Critical Care 02/22/2015, 9:09 AM Pager:  [phone] After 3pm call: [phone]

## 2015-02-22 NOTE — Progress Notes (Signed)
Patient is back on HFNC at 10 liters.

## 2015-02-23 DIAGNOSIS — J984 Other disorders of lung: Secondary | ICD-10-CM

## 2015-02-23 DIAGNOSIS — J449 Chronic obstructive pulmonary disease, unspecified: Secondary | ICD-10-CM

## 2015-02-23 DIAGNOSIS — G629 Polyneuropathy, unspecified: Secondary | ICD-10-CM

## 2015-02-23 LAB — VITAMIN B12: VITAMIN B 12: 374 pg/mL (ref 180–914)

## 2015-02-23 MED ORDER — CYANOCOBALAMIN 1000 MCG/ML IJ SOLN
1000.0000 ug | Freq: Every day | INTRAMUSCULAR | Status: DC
  Administered 2015-02-23 – 2015-02-25 (×3): 1000 ug via SUBCUTANEOUS
  Filled 2015-02-23 (×3): qty 1

## 2015-02-23 MED ORDER — ALBUTEROL SULFATE (2.5 MG/3ML) 0.083% IN NEBU: 5.0000 mg | INHALATION_SOLUTION | RESPIRATORY_TRACT | Status: DC | PRN

## 2015-02-23 MED ORDER — DM-GUAIFENESIN ER 30-600 MG PO TB12: 1.0000 | ORAL_TABLET | Freq: Two times a day (BID) | ORAL | Status: DC | PRN

## 2015-02-23 NOTE — Progress Notes (Signed)
[place] TEAM 1 - Stepdown/ICU TEAM PROGRESS NOTE  Bryan Gillespie ION:[id number] DOB: [birth year] DOA: 02/14/2015 PCP: Pcp Not In System  Admit HPI / Brief Narrative: 49 y.o.M Hx Hypertension; Bowel obstruction; Peripheral neuropathy, and Tobacco Abuse who presented with worsening shortness of breath over 3 days associated w/ generalized weakness.  Patient stated he had to be intubated 2 years ago for similar sx.   In emergency department was found to have ABG 7.290/CO2 70.7 and PO2 of 58.0.  Troponin was normal - hemoglobin elevated at 18 - PCO2 of 37 - CTA of the chest did not show any evidence of PE.  HPI/Subjective: The patient is resting comfortably in bed.  He denies shortness of breath chest pain nausea vomiting fevers or chills.  He does report tingling in his feet and legs which she states is due to chronic neuropathy though he cannot tell me the cause.  Assessment/Plan:  Idiopathic Acute respiratory failure with hypoxia and hypercarbia  The patient's severe hypoxia remains somewhat of a mystery - CT of the chest did NOT reveal severe parenchymal findings suggestive of advanced COPD - transcranial Doppler was negative for evidence of a right to left shunt - PFTs actually suggested more of a restrictive process - will ask Neuro to see to comment on possible need for neuromuscular eval - diuresis of 20+L has not significantly improved oxygenation   Essential hypertension  Blood pressure currently well controlled  Tobacco abuse I have advised the patient that he must absolutely discontinue smoking completely and permanently to include marijuana - I have explained to him that if he continues to smoke he will die and that it will likely occur within the next year - I have also advised him that he should avoid any exposure to secondhand smoke  Idiopathic Polycythemia Felt initially to be secondary to persistent hypoxia, but Hgb is actually climbing w/ ongoing O2 use - this may, however, be  due to signif diuresis, w/ pt down 20L since admit - stop diuresis and follow - if Hgb continues to climb, will request Heme consult   Macrocytosis - relative B12 deficiency  B12 is <400, so will initiate replacement - folate is normal   Code Status: FULL Family Communication: No family present at time of exam today Disposition Plan: stable for transfer to medical bed - request Neuro consult - PT/OT evals - will clearly need home O2 at time of d/c   Consultants: PCCM Neurology   Procedures: TTE - 1/24 - EF 60-65 percent - grade 1 diastolic dysfunction - no significant valvular abnormalities - mild/moderately dilated RV Transcranial Doppler - 1/26 - no evidence of right to left intracardiac or pulmonary shunt  Antibiotics: Azithromycin 1/24 > 1/26 Ceftriaxone 1/24 > 1/26 Levaquin 1/28 > 1/29  DVT prophylaxis: lovenox   Objective: Blood pressure 111/78, pulse 85, temperature 98 F (36.7 C), temperature source Oral, resp. rate 17, height  (1.803 m), weight 105.7 kg (233 lb 0.4 oz), SpO2 91 %.  Intake/Output Summary (Last 24 hours) at 02/23/15 1028 Last data filed at 02/23/15 0700  Gross per 24 hour  Intake    480 ml  Output   2750 ml  Net  -2270 ml   Exam: General: Alert and conversant  - no resp distress on 3L [place] Lungs: Very poor air movement throughout all fields - no wheeze Cardiovascular: distant heart sounds - RRR - no M  Abdomen: Nontender, nondistended, soft, bowel sounds positive, no rebound, no ascites, no appreciable mass  Extremities: No significant cyanosis, or clubbing; trace edema bilateral lower extremities  Data Reviewed:  Basic Metabolic Panel:  Recent Labs Lab 02/17/15 0830 02/19/15 0903 02/20/15 1047  NA 143 139 143  K 4.7 4.6 4.3  CL 96* 93* 89*  CO2 37* 36* 40*  GLUCOSE 96 121* 81  BUN 15 11 16   CREATININE 0.92 0.88 1.01  CALCIUM 8.9 8.9 9.3  MG 2.3 2.1 2.4    CBC:  Recent Labs Lab 02/17/15 0830 02/19/15 0903 02/20/15 1047  02/22/15 0429  WBC 11.8* 8.6 7.4 9.4  NEUTROABS 9.9* 7.7  --   --   HGB 17.9* 18.0* 19.4* 19.8*  HCT 56.8* 57.7* 62.5* 61.8*  MCV 101.8* 102.1* 102.8* 98.6  PLT 118* 113* 130* 139*    Liver Function Tests:  Recent Labs Lab 02/17/15 0830 02/19/15 0903  AST 18 22  ALT 29 43  ALKPHOS 55 53  BILITOT 0.7 0.8  PROT 6.0* 6.2*  ALBUMIN 3.1* 3.1*    Recent Results (from the past 240 hour(s))  Culture, blood (routine x 2) Call MD if unable to obtain prior to antibiotics being given     Status: None   Collection Time: 02/15/15  3:38 AM  Result Value Ref Range Status   Specimen Description BLOOD RIGHT ARM  Final   Special Requests AEROBIC BOTTLE ONLY  Final   Culture NO GROWTH 5 DAYS  Final   Report Status 02/20/2015 FINAL  Final  Culture, blood (routine x 2) Call MD if unable to obtain prior to antibiotics being given     Status: None   Collection Time: 02/15/15  4:08 AM  Result Value Ref Range Status   Specimen Description BLOOD RIGHT HAND  Final   Special Requests IN PEDIATRIC BOTTLE  Final   Culture NO GROWTH 5 DAYS  Final   Report Status 02/20/2015 FINAL  Final  MRSA PCR Screening     Status: None   Collection Time: 02/15/15 10:58 AM  Result Value Ref Range Status   MRSA by PCR NEGATIVE NEGATIVE Final    Comment:        The GeneXpert MRSA Assay (FDA approved for NASAL specimens only), is one component of a comprehensive MRSA colonization surveillance program. It is not intended to diagnose MRSA infection nor to guide or monitor treatment for MRSA infections.   Respiratory virus panel     Status: None   Collection Time: 02/15/15 10:58 AM  Result Value Ref Range Status   Respiratory Syncytial Virus A Negative Negative Final   Respiratory Syncytial Virus B Negative Negative Final   Influenza A Negative Negative Final   Influenza B Negative Negative Final   Parainfluenza 1 Negative Negative Final   Parainfluenza 2 Negative Negative Final   Parainfluenza 3  Negative Negative Final   Metapneumovirus Negative Negative Final   Rhinovirus Negative Negative Final   Adenovirus Negative Negative Final    Comment: (NOTE) Performed At: [facility] [address] [place], [place] [id number] Mila Homer MD NW:[id number]      Studies:   Recent x-ray studies have been reviewed in detail by the Attending Physician  Scheduled Meds:  Scheduled Meds: . aspirin EC  81 mg Oral Daily  . carvedilol  3.125 mg Oral BID WC  . dextromethorphan-guaiFENesin  1 tablet Oral BID  . enoxaparin (LOVENOX) injection  40 mg Subcutaneous Daily  . furosemide  80 mg Intravenous TID  . mometasone-formoterol  2 puff Inhalation BID  . nicotine  21 mg Transdermal Daily  . predniSONE  20 mg Oral Q breakfast  . sodium chloride  3 mL Intravenous Q12H  . tiotropium  18 mcg Inhalation Daily    Time spent on care of this patient: 35 mins   Traycen Goyer T , MD   [facility] Office  [phone] Pager - Text Page per Loretha Stapler as per below:  On-Call/Text Page:      [email or web address]      password [email or web address]  If 7PM-7AM, please contact night-coverage [email or web address] Password [email or web address] 02/23/2015, 10:28 AM   LOS: 9 days

## 2015-02-23 NOTE — Progress Notes (Signed)
PCCM PROGRESS NOTE  ADMISSION DATE: 02/14/2015 CONSULT DATE: 02/15/2015 REFERRING PROVIDER: [facility]  CC: Short of breath  SUBJECTIVE: Dyspnea improved today. Denies chest pain. Ambulated to bathroom without problem. Occasional cough.  VITAL SIGNS: BP 111/78 mmHg  Pulse 85  Temp(Src) 98 F (36.7 C) (Oral)  Resp 17  Ht  (1.803 m)  Wt 233 lb 0.4 oz (105.7 kg)  BMI 32.51 kg/m2  SpO2 91%  INTAKE/OUTPUT: I/O last 3 completed shifts: In: 840 [P.O.:840] Out: 4700 [Urine:4700]  General: alert HEENT: no sinus tenderness Cardiac: regular, no murmur Chest: no wheeze Abd: soft, non tender Ext: no edema Neuro: 4/5 strength Rt arm, otherwise 5/5, CN intact Skin: no rashes   CBC Recent Labs     02/22/15  0429  WBC  9.4  HGB  19.8*  HCT  61.8*  PLT  139*    BMET No results for input(s): NA, K, CL, CO2, BUN, CREATININE, GLUCOSE in the last 72 hours.  Electrolytes No results for input(s): CALCIUM, MG, PHOS in the last 72 hours.  ABG    Component Value Date/Time   PHART 7.372 02/16/2015 1205   PCO2ART 59.3* 02/16/2015 1205   PO2ART 63.3* 02/16/2015 1205   HCO3 33.6* 02/16/2015 1205   TCO2 35.4 02/16/2015 1205   O2SAT 89.9 02/16/2015 1205    Liver Enzymes No results for input(s): AST, ALT, ALKPHOS, BILITOT, ALBUMIN in the last 72 hours.  Imaging No results found.   ANTIBIOTICS: 1/23 Rocephin >> 1/26 1/23 Zithromax >> 1/26 1/28 Levaquin >>  STUDIES: 1/23 CT chest >> ATX 1/24 Echo >> EF 60 to 65%, grade 1 diastolic dysfx 1/26 Transcranial doppler >> negative for shunt 1/31 PFT >> FEV1 1.59 (40%), FEV1% 83, TLC 3.78 (54%), DLCO 55%, no BD  EVENTS: 1/23 Admit  DISCUSSION: 50 yo male smoker presented with generalized weakness, swelling of legs/lips, and dyspnea associated with cough.  He was found to have acute on chronic hypoxic/hypercapnic respiratory failure.  His PFTs concerning for mixed obstruction and restrictive  process.  ASSESSMENT/PLAN:  Acute on chronic hypoxic/hypercapnic respiratory failure >> not clear this is solely from COPD and atelectasis.  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 May have mix with some obstruction. - oxygen to keep SpO2 > 90% - Neurology consulted and recommendations pending - bronchial hygiene - will consider alpha 1 antitrypsin testing as outpatient  AECOPD. - continue spiriva, dulera - prn albuterol - wean off prednisone as tolerated  Tobacco abuse. - nicotine patch  HTN. - per primary team  Polycythemia >> initial assessment was polycythemia related to hypoxia, but has not improved with supplemental oxygen therapy. Thrombocytopenia. Plan: - Check CBC tomorrow. If no improvement or worsening of hgb would consult heme.   Griffin Basil, MD  Internal Medicine PGY-2 02/23/2015, 11:37 AM

## 2015-02-23 NOTE — Consult Note (Signed)
NEURO HOSPITALIST CONSULT NOTE   Requestig physician: Dr. Sharon Seller   Reason for Consult: question neuromuscular disorder causing hypoxia  HPI:                                                                                                                                          Bryan Gillespie is an 50 y.o. male with PMHx of HTN, bowel obstruction, and neuropathy. He presented to ED with c/o progressive SOB x 3 days. About one week ago when attempting to ambulate to neighbors house he very SOB, and felt as though "he was going to die". He called EMS. In the ED he reported recent diagnoses of flu and bronchitis. He was found to have ABG 7.290/CO2 70.7 and PO2 of 58.0. He was started on BiPAP. He underwent CTA of the chest with concern for PE, scan was only positive for some ATX vs less likely PNA. He does not carry a formal diagnosis of this. He is a long time and current every day smoker. His PFT did not show signs of COPD but more af a restrictive disease. 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 He states his symptoms progressively worsened since that time. Nothing he knows has made his symptoms better. On the day of admission he awoke and felt his face was full and he had blue lips.  He tried to walk to his neighbors and could not get air.  He denies an fatiguing symptoms.   Past Medical History  Diagnosis Date  . Hypertension   . Bowel obstruction (HCC)   . Peripheral neuropathy [facility])     Past Surgical History  Procedure Laterality Date  . Abdominal surgery    . Hernia repair      Family History  Problem Relation Age of Onset  . Diabetes Mother   . Hypertension Other       Social History:  reports that he has been smoking.  He does not have any smokeless tobacco history on file. He reports that he does not drink  alcohol or use illicit drugs.  No Known Allergies  MEDICATIONS:  Prior to Admission:  Prescriptions prior to admission  Medication Sig Dispense Refill Last Dose  . amLODipine (NORVASC) 10 MG tablet Take 10 mg by mouth daily.   02/14/2015 at Unknown time   Scheduled: . aspirin EC  81 mg Oral Daily  . carvedilol  3.125 mg Oral BID WC  . cyanocobalamin  1,000 mcg Subcutaneous Daily  . dextromethorphan-guaiFENesin  1 tablet Oral BID  . enoxaparin (LOVENOX) injection  40 mg Subcutaneous Daily  . mometasone-formoterol  2 puff Inhalation BID  . nicotine  21 mg Transdermal Daily  . predniSONE  20 mg Oral Q breakfast  . sodium chloride  3 mL Intravenous Q12H  . tiotropium  18 mcg Inhalation Daily     ROS:                                                                                                                                       History obtained from the patient  General ROS: negative for - chills, fatigue, fever, night sweats, weight gain or weight loss Psychological ROS: negative for - behavioral disorder, hallucinations, memory difficulties, mood swings or suicidal ideation Ophthalmic ROS: negative for - blurry vision, double vision, eye pain or loss of vision ENT ROS: negative for - epistaxis, nasal discharge, oral lesions, sore throat, tinnitus or vertigo Allergy and Immunology ROS: negative for - hives or itchy/watery eyes Hematological and Lymphatic ROS: negative for - bleeding problems, bruising or swollen lymph nodes Endocrine ROS: negative for - galactorrhea, hair pattern changes, polydipsia/polyuria or temperature intolerance Respiratory ROS: negative for - cough, hemoptysis, shortness of breath or wheezing Cardiovascular ROS: negative for - chest pain, dyspnea on exertion, edema or irregular heartbeat Gastrointestinal ROS: negative for - abdominal pain,  diarrhea, hematemesis, nausea/vomiting or stool incontinence Genito-Urinary ROS: negative for - dysuria, hematuria, incontinence or urinary frequency/urgency Musculoskeletal ROS: negative for - joint swelling or muscular weakness Neurological ROS: as noted in HPI Dermatological ROS: negative for rash and skin lesion changes   Blood pressure 111/78, pulse 85, temperature 98 F (36.7 C), temperature source Oral, resp. rate 17, height 5\' 11"  (1.803 m), weight 105.7 kg (233 lb 0.4 oz), SpO2 91 %.   Neurologic Examination:                                                                                                      HEENT-  Normocephalic, no lesions, without obvious abnormality.  Normal external eye and conjunctiva.  Normal TM's bilaterally.  Normal auditory  canals and external ears. Normal external nose, mucus membranes and septum.  Normal pharynx. Cardiovascular- S1, S2 normal, pulses palpable throughout   Lungs- chest clear, no wheezing, rales, normal symmetric air entry Abdomen- normal findings: bowel sounds normal Extremities- no edema Lymph-no adenopathy palpable Musculoskeletal-no joint tenderness, deformity or swelling Skin-warm and dry, no hyperpigmentation, vitiligo, or suspicious lesions  Neurological Examination Mental Status: Alert, oriented, thought content appropriate.  Speech fluent without evidence of aphasia.  Able to follow 3 step commands without difficulty. Cranial Nerves: II: Discs flat bilaterally; Visual fields grossly normal, pupils equal, round, reactive to light and accommodation III,IV, VI: ptosis not present, extra-ocular motions intact bilaterally. No ptosis with prolonged vertical gaze V,VII: smile symmetric, facial light touch sensation normal bilaterally VIII: hearing normal bilaterally IX,X: uvula rises symmetrically XI: bilateral shoulder shrug XII: midline tongue extension Motor: Right : Upper extremity   5/5    Left:     Upper extremity    5/5  Lower extremity   5/5     Lower extremity   5/5 Tone and bulk:normal tone throughout; no atrophy noted Sensory: Pinprick and light touch intact throughout, bilaterally UE and LE have diminished sensation to his knees and in his right hand but this is chronic from both long history of drinking and different fractures.  He has decreased vibration at the toes and knees, intact in the hand Deep Tendon Reflexes: 1+ and symmetric throughout UE no KJ or AJ Plantars: Right: downgoing   Left: downgoing Cerebellar: normal finger-to-nose,  and normal heel-to-shin test Gait: not tested      Lab Results: Basic Metabolic Panel:  Recent Labs Lab 02/17/15 0830 02/19/15 0903 02/20/15 1047  NA 143 139 143  K 4.7 4.6 4.3  CL 96* 93* 89*  CO2 37* 36* 40*  GLUCOSE 96 121* 81  BUN 15 11 16   CREATININE 0.92 0.88 1.01  CALCIUM 8.9 8.9 9.3  MG 2.3 2.1 2.4    Liver Function Tests:  Recent Labs Lab 02/17/15 0830 02/19/15 0903  AST 18 22  ALT 29 43  ALKPHOS 55 53  BILITOT 0.7 0.8  PROT 6.0* 6.2*  ALBUMIN 3.1* 3.1*   No results for input(s): LIPASE, AMYLASE in the last 168 hours. No results for input(s): AMMONIA in the last 168 hours.  CBC:  Recent Labs Lab 02/17/15 0830 02/19/15 0903 02/20/15 1047 02/22/15 0429  WBC 11.8* 8.6 7.4 9.4  NEUTROABS 9.9* 7.7  --   --   HGB 17.9* 18.0* 19.4* 19.8*  HCT 56.8* 57.7* 62.5* 61.8*  MCV 101.8* 102.1* 102.8* 98.6  PLT 118* 113* 130* 139*    Cardiac Enzymes: No results for input(s): CKTOTAL, CKMB, CKMBINDEX, TROPONINI in the last 168 hours.  Lipid Panel: No results for input(s): CHOL, TRIG, HDL, CHOLHDL, VLDL, LDLCALC in the last 168 hours.  CBG: No results for input(s): GLUCAP in the last 168 hours.  Microbiology: Results for orders placed or performed during the hospital encounter of 02/14/15  Culture, blood (routine x 2) Call MD if unable to obtain prior to antibiotics being given     Status: None   Collection Time:  02/15/15  3:38 AM  Result Value Ref Range Status   Specimen Description BLOOD RIGHT ARM  Final   Special Requests AEROBIC BOTTLE ONLY  Final   Culture NO GROWTH 5 DAYS  Final   Report Status 02/20/2015 FINAL  Final  Culture, blood (routine x 2) Call MD if unable to obtain prior to antibiotics being  given     Status: None   Collection Time: 02/15/15  4:08 AM  Result Value Ref Range Status   Specimen Description BLOOD RIGHT HAND  Final   Special Requests IN PEDIATRIC BOTTLE  Final   Culture NO GROWTH 5 DAYS  Final   Report Status 02/20/2015 FINAL  Final  MRSA PCR Screening     Status: None   Collection Time: 02/15/15 10:58 AM  Result Value Ref Range Status   MRSA by PCR NEGATIVE NEGATIVE Final    Comment:        The GeneXpert MRSA Assay (FDA approved for NASAL specimens only), is one component of a comprehensive MRSA colonization surveillance program. It is not intended to diagnose MRSA infection nor to guide or monitor treatment for MRSA infections.   Respiratory virus panel     Status: None   Collection Time: 02/15/15 10:58 AM  Result Value Ref Range Status   Respiratory Syncytial Virus A Negative Negative Final   Respiratory Syncytial Virus B Negative Negative Final   Influenza A Negative Negative Final   Influenza B Negative Negative Final   Parainfluenza 1 Negative Negative Final   Parainfluenza 2 Negative Negative Final   Parainfluenza 3 Negative Negative Final   Metapneumovirus Negative Negative Final   Rhinovirus Negative Negative Final   Adenovirus Negative Negative Final    Comment: (NOTE) Performed At: [facility] [address] [place], [place] [id number] Mila Homer MD WU:[id number]     Coagulation Studies: No results for input(s): LABPROT, INR in the last 72 hours.  Imaging: No results found.     Assessment and plan per attending neurologist  Felicie Morn PA-C [facility] [phone]  02/23/2015, 10:51  AM   Assessment/Plan: 50 YO male with 2 month history of progressive SOB. In hospital found to be hypercarbic and hypoxic with restrictive pattern on PFTs. Etiology remains unclear.  I see no signs of hyperreflexia or other upper motor neuron findings disease to suggest motor neuron disease. The timing is not consistent with AIDP given that he has had at least 6 months of progressive numbness.  Further evaluation would need to include nerve conduction studies/EMG. 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  Recommend: 1) Myasthenia panel, if this is negative would have follow up as out patient with neurology for EMG/NCV and consideration of diaphragmatic EMG.  2) initial evaluation of peripheral neuropathy including B12, A1c, SPEP 3) I will follow-up the myasthenia panel, this can take quite some time to come back. Please call with any further questions or concerns.  Ritta Slot, MD [facility] [phone]  If 7pm- 7am, please page neurology on call as listed in AMION.

## 2015-02-23 NOTE — Care Management Note (Signed)
Case Management Note  Patient Details  Name: Bryan Gillespie MRN: [id number] Date of Birth: Sep 27, 1965  Subjective/Objective:        Patient has been weaned down to 2 liters, he will need home oxygen and possibly neb machine at dc.  NCM has been in touch with Jermaine with [facility], patient has medicaid potential.  When orders in for DME and sats documented please call Jermaine to set up home oxygen. Patient pcp is Willey Blade at [facility] [address].            Action/Plan:   Expected Discharge Date:                  Expected Discharge Plan:  Home/Self Care  In-House Referral:     Discharge planning Services  CM Consult  Post Acute Care Choice:    Choice offered to:     DME Arranged:    DME Agency:     HH Arranged:    HH Agency:     Status of Service:  In process, will continue to follow  Medicare Important Message Given:    Date Medicare IM Given:    Medicare IM give by:    Date Additional Medicare IM Given:    Additional Medicare Important Message give by:     If discussed at Long Length of Stay Meetings, dates discussed:    Additional Comments:  Leone Haven, RN 02/23/2015, 4:53 PM

## 2015-02-24 DIAGNOSIS — D751 Secondary polycythemia: Secondary | ICD-10-CM

## 2015-02-24 LAB — COMPREHENSIVE METABOLIC PANEL
ALK PHOS: 53 U/L (ref 38–126)
ALT: 32 U/L (ref 17–63)
ANION GAP: 11 (ref 5–15)
AST: 18 U/L (ref 15–41)
Albumin: 3.2 g/dL — ABNORMAL LOW (ref 3.5–5.0)
BILIRUBIN TOTAL: 1.2 mg/dL (ref 0.3–1.2)
BUN: 21 mg/dL — ABNORMAL HIGH (ref 6–20)
CALCIUM: 9.2 mg/dL (ref 8.9–10.3)
CO2: 39 mmol/L — ABNORMAL HIGH (ref 22–32)
Chloride: 89 mmol/L — ABNORMAL LOW (ref 101–111)
Creatinine, Ser: 0.99 mg/dL (ref 0.61–1.24)
GFR calc non Af Amer: >60 mL/min (ref >60)
Glucose, Bld: 92 mg/dL (ref 65–99)
Potassium: 4 mmol/L (ref 3.5–5.1)
Sodium: 139 mmol/L (ref 135–145)
TOTAL PROTEIN: 6.3 g/dL — AB (ref 6.5–8.1)

## 2015-02-24 LAB — CBC
HEMATOCRIT: 59.2 % — AB (ref 39.0–52.0)
HEMOGLOBIN: 19.4 g/dL — AB (ref 13.0–17.0)
MCH: 32.1 pg (ref 26.0–34.0)
MCHC: 32.8 g/dL (ref 30.0–36.0)
MCV: 98 fL (ref 78.0–100.0)
Platelets: 143 10*3/uL — ABNORMAL LOW (ref 150–400)
RBC: 6.04 MIL/uL — AB (ref 4.22–5.81)
RDW: 14.2 % (ref 11.5–15.5)
WBC: 6.5 10*3/uL (ref 4.0–10.5)

## 2015-02-24 LAB — PROTEIN ELECTROPHORESIS, SERUM
A/G Ratio: 1 (ref 0.7–1.7)
ALPHA-1-GLOBULIN: 0.2 g/dL (ref 0.0–0.4)
ALPHA-2-GLOBULIN: 0.9 g/dL (ref 0.4–1.0)
Albumin ELP: 3.5 g/dL (ref 2.9–4.4)
Beta Globulin: 1.3 g/dL (ref 0.7–1.3)
GAMMA GLOBULIN: 1 g/dL (ref 0.4–1.8)
Globulin, Total: 3.4 g/dL (ref 2.2–3.9)
Total Protein ELP: 6.9 g/dL (ref 6.0–8.5)

## 2015-02-24 LAB — HEMOGLOBIN A1C
HEMOGLOBIN A1C: 6 % — AB (ref 4.8–5.6)
Mean Plasma Glucose: 126 mg/dL

## 2015-02-24 NOTE — Progress Notes (Signed)
Subjective: He feels his breathing is improving  Exam: Filed Vitals:   02/24/15 0440 02/24/15 0813  BP: 104/74 109/74  Pulse: 77 87  Temp: 97.8 F (36.6 C) 98.2 F (36.8 C)  Resp: 18 20    HEENT-  Normocephalic, no lesions, without obvious abnormality.  Normal external eye and conjunctiva.  Normal TM's bilaterally.  Normal auditory canals and external ears. Normal external nose, mucus membranes and septum.  Normal pharynx. Cardiovascular- S1, S2 normal, pulses palpable throughout   Lungs- chest clear, no wheezing, rales, normal symmetric air entry Abdomen- normal findings: bowel sounds normal Extremities- no edema Lymph-no adenopathy palpable Musculoskeletal-no joint tenderness, deformity or swelling     Gen: In bed, NAD MS: Alert and oriented. CN: 2-12 grossly intact Motor: MAEW Sensory: Pinprick and light touch intact throughout, bilaterally UE and LE have diminished sensation to his knees and in his right hand but this is chronic from both long history of drinking and different fractures. He has decreased vibration at the toes and knees, intact in the hand DTR: 1+ in UE no KJ or AJ  Pertinent Labs: A1c 6.0 B12 374  Felicie Morn PA-C [facility] [phone]  Impression: 50 YO male with 2 month history of progressive SOB. In hospital found to be hypercarbic and hypoxic with restrictive pattern on PFTs. Etiology remains unclear.   Recommendations: 1) Myasthenia panel, if this is negative would have follow up as out patient with neurology for EMG/NCV and consideration of diaphragmatic EMG 2) will follow SPEP 3) I will follow-up the myasthenia panel, this can take quite some time to come back. Please call with any further questions or concerns.   02/24/2015, 9:09 AM
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
## 2015-02-24 NOTE — Progress Notes (Signed)
Bryan Gillespie TEAM 1 - Stepdown/ICU TEAM Progress Note  Bryan Gillespie WUJ:[id number] DOB: 12/31/1965 DOA: 02/14/2015 PCP: Pcp Not In System  Admit HPI / Brief Narrative: Bryan Gillespie is a 50 y.o. WM PMHx Hypertension; Bowel obstruction (HCC); and Peripheral neuropathy (HCC), Tobacco Abuse.   Presented with worsening shortness of breath over the past 3 days associated generalized weakness He reports today he try to walk over to his neighbor's house and developed acute worsening shortness of breath he was" felt like he is going to die" so he returned and called EMS. He reports that he has presented similar symptoms to outpatient pruritus and was diagnosed with bronchitis and flu. Patient states that he had to be intubated 2 years ago. There is no records in the system regarding this  In emergency department was found to have ABG 7.290/CO2 70.7 and PO2 of 58.0 was transiently on BiPAP Troponin was normal hemoglobin noted to be elevated at 18 PCO2 of 37 CTA of the chest did not show any evidence of PE some evidence of atelectasis but less likely pneumonia  Was discussed with [facility] at this point felt the patient was stable for admission to stepdown  Hospitalist was called for admission for acute respiratory failure  HPI/Subjective: 2/2  A/O 4, NAD. Sitting in bed comfortably, states ambulated around ward on O2 positive DOE    Assessment/Plan: Acute respiratory failure with hypoxia and hypercarbia (HCC)/COPD exacerbation/CAP  -it is unclear if patient may have some CO2 retention chronically. He denies being on oxygen likely this is an acute change.  -Most likely multifactorial to include COPD Exacerbation /CAPS, CHF? -Continue empiric antibiotics for total 7 days of treatment -Spiriva daily -Mucinex DM -Patient now down to nasal cannula 2 L O2 with satisfactory SPO2 -PFTs more consistent with restrictive lung disease  -Neurology waiting for results "Myasthenia panel, B-12, A1c, SPEP    Dilated cardiomyopathy (mild)  -See echocardiogram below  -Strict in and out since admission  -20.7 L -Daily weight to my: Admission weight= 117.9 kg                2/2 weight= 115.6 kg  -1/28 PCXR; bilateral pleural effusion with pulmonary congestion (my read) -Coreg 3.125 mg BID  Essential hypertension  -See dilated cardiomyopathy   Tobacco abuse - Continue Nicotine Patch. -1/26 counseled patient on seriousness of his illness, explained that if he continued to smoke this would result in his death. Patient stated he no longer was going to smoke. Continue: Code Status as Full   Polycythemia secondary? -Would suspect that H/H was start to decrease as patients respiratory status improved. But H/H continues to increase -Consult hematology oncology in A.m. if hemoglobin still trending up    Code Status: FULL Family Communication: no family present at time of exam Disposition Plan: SNF?    Consultants: [facility]   Procedure/Significant Events: 1/23 CT angiogram PE protocol;- negative PE.-Bibasilar peribronchovascular linear opacities in dependent distribution, likely subsegmental atelectasis.Pneumonia although possible is felt less likely. 1/24 echocardiogram;LVEF= 60% to 65%. -(grade 1 diastolic dysfunction). - Left atrium: mildly dilated. - Right ventricle: mildly to moderately dilated- Right atrium: mildly dilated.    Culture 1/24 sputum culture not obtained 1/24 blood right arm/hand NGTD 1/24 MRSA by PCR negative 1/24 influenza A/B/H1N1 negative 1/24 virus panel pending 1/24 Legionella/strep pneumo urine antigen negative    Antibiotics: Azithromycin 1/24>> 1/27 Ceftriaxone 1/24>> 1/27 Levofloxacin 1/28>> 1/29   DVT prophylaxis: Lovenox   Devices    LINES /  TUBES:      Continuous Infusions:   Objective: VITAL SIGNS: Temp: 98.2 F (36.8 C) (02/02 0813) Temp Source: Oral (02/02 0813) BP: 109/74 mmHg (02/02 0813) Pulse Rate: 87  (02/02 0813) SPO2; FIO2:   Intake/Output Summary (Last 24 hours) at 02/24/15 0848 Last data filed at 02/24/15 4098  Gross per 24 hour  Intake    960 ml  Output   2176 ml  Net  -1216 ml     Exam: General: A/O 4, NAD, positive Acute Respiratory Distress (on 2 L O2 nasal cannula) Eyes: Negative headache,negative scleral hemorrhage ENT: Negative Runny nose, negative gingival bleeding, Neck:  Negative scars, masses, torticollis, lymphadenopathy, JVD Lungs: air movement all lung fields,negative expiratory wheezes, positive crackles RLL Cardiovascular: Regular rhythm and rate without murmur gallop or rub normal S1 and S2 Abdomen:negative abdominal pain, nondistended, positive soft, bowel sounds, no rebound, no ascites, no appreciable mass Extremities: No significant cyanosis, clubbing. Plus bilateral lower extremity edema 1+ Psychiatric:  Negative depression, negative anxiety, negative fatigue, negative mania  Neurologic:  Cranial nerves II through XII intact, tongue/uvula midline, all extremities muscle strength 5/5, sensation intact throughout,  negative dysarthria, negative expressive aphasia, negative receptive aphasia.   Data Reviewed: Basic Metabolic Panel:  Recent Labs Lab 02/19/15 0903 02/20/15 1047 02/24/15 0517  NA 139 143 139  K 4.6 4.3 4.0  CL 93* 89* 89*  CO2 36* 40* 39*  GLUCOSE 121* 81 92  BUN 11 16 21*  CREATININE 0.88 1.01 0.99  CALCIUM 8.9 9.3 9.2  MG 2.1 2.4  --    Liver Function Tests:  Recent Labs Lab 02/19/15 0903 02/24/15 0517  AST 22 18  ALT 43 32  ALKPHOS 53 53  BILITOT 0.8 1.2  PROT 6.2* 6.3*  ALBUMIN 3.1* 3.2*   No results for input(s): LIPASE, AMYLASE in the last 168 hours. No results for input(s): AMMONIA in the last 168 hours. CBC:  Recent Labs Lab 02/19/15 0903 02/20/15 1047 02/22/15 0429 02/24/15 0517  WBC 8.6 7.4 9.4 6.5  NEUTROABS 7.7  --   --   --   HGB 18.0* 19.4* 19.8* 19.4*  HCT 57.7* 62.5* 61.8* 59.2*  MCV 102.1*  102.8* 98.6 98.0  PLT 113* 130* 139* 143*   Cardiac Enzymes: No results for input(s): CKTOTAL, CKMB, CKMBINDEX, TROPONINI in the last 168 hours. BNP (last 3 results)  Recent Labs  02/14/15 1800  BNP 28.9    ProBNP (last 3 results) No results for input(s): PROBNP in the last 8760 hours.  CBG: No results for input(s): GLUCAP in the last 168 hours.  Recent Results (from the past 240 hour(s))  Culture, blood (routine x 2) Call MD if unable to obtain prior to antibiotics being given     Status: None   Collection Time: 02/15/15  3:38 AM  Result Value Ref Range Status   Specimen Description BLOOD RIGHT ARM  Final   Special Requests AEROBIC BOTTLE ONLY  Final   Culture NO GROWTH 5 DAYS  Final   Report Status 02/20/2015 FINAL  Final  Culture, blood (routine x 2) Call MD if unable to obtain prior to antibiotics being given     Status: None   Collection Time: 02/15/15  4:08 AM  Result Value Ref Range Status   Specimen Description BLOOD RIGHT HAND  Final   Special Requests IN PEDIATRIC BOTTLE  Final   Culture NO GROWTH 5 DAYS  Final   Report Status 02/20/2015 FINAL  Final  MRSA  PCR Screening     Status: None   Collection Time: 02/15/15 10:58 AM  Result Value Ref Range Status   MRSA by PCR NEGATIVE NEGATIVE Final    Comment:        The GeneXpert MRSA Assay (FDA approved for NASAL specimens only), is one component of a comprehensive MRSA colonization surveillance program. It is not intended to diagnose MRSA infection nor to guide or monitor treatment for MRSA infections.   Respiratory virus panel     Status: None   Collection Time: 02/15/15 10:58 AM  Result Value Ref Range Status   Respiratory Syncytial Virus A Negative Negative Final   Respiratory Syncytial Virus B Negative Negative Final   Influenza A Negative Negative Final   Influenza B Negative Negative Final   Parainfluenza 1 Negative Negative Final   Parainfluenza 2 Negative Negative Final   Parainfluenza 3  Negative Negative Final   Metapneumovirus Negative Negative Final   Rhinovirus Negative Negative Final   Adenovirus Negative Negative Final    Comment: (NOTE) Performed At: [facility] [address] [place], [place] [id number] Mila Homer MD BJ:[id number]      Studies:  Recent x-ray studies have been reviewed in detail by the Attending Physician  Scheduled Meds:  Scheduled Meds: . aspirin EC  81 mg Oral Daily  . carvedilol  3.125 mg Oral BID WC  . cyanocobalamin  1,000 mcg Subcutaneous Daily  . enoxaparin (LOVENOX) injection  40 mg Subcutaneous Daily  . mometasone-formoterol  2 puff Inhalation BID  . nicotine  21 mg Transdermal Daily  . sodium chloride  3 mL Intravenous Q12H  . tiotropium  18 mcg Inhalation Daily    Time spent on care of this patient: 40 mins   Jaevon Paras, Roselind Messier , MD  [facility] Office  [phone] Pager - [phone]  On-Call/Text Page:      [email or web address]      password [email or web address]  If 7PM-7AM, please contact night-coverage [email or web address] Password [email or web address] 02/24/2015, 8:48 AM   LOS: 10 days   Care during the described time interval was provided by me .  I have reviewed this patient's available data, including medical history, events of note, physical examination, and all test results as part of my evaluation. I have personally reviewed and interpreted all radiology studies.   Carolyne Littles, MD [phone] Pager

## 2015-02-24 NOTE — Progress Notes (Signed)
PCCM PROGRESS NOTE  ADMISSION DATE: 02/14/2015 CONSULT DATE: 02/15/2015 REFERRING PROVIDER: [facility]  CC: Short of breath  SUBJECTIVE: Down to 2 liters oxygen.  Walked in hall yesterday.  VITAL SIGNS: BP 109/74 mmHg  Pulse 87  Temp(Src) 98.2 F (36.8 C) (Oral)  Resp 20  Ht  (1.803 m)  Wt 254 lb 13.6 oz (115.6 kg)  BMI 35.56 kg/m2  SpO2 91%  INTAKE/OUTPUT: I/O last 3 completed shifts: In: 1080 [P.O.:1080] Out: 3876 [Urine:3875; Stool:1]  General: alert HEENT: no sinus tenderness Cardiac: regular, no murmur Chest: no wheeze Abd: soft, non tender Ext: no edema Neuro: 4/5 strength Rt arm, otherwise 5/5, CN intact Skin: no rashes   CBC Recent Labs     02/22/15  0429  02/24/15  0517  WBC  9.4  6.5  HGB  19.8*  19.4*  HCT  61.8*  59.2*  PLT  139*  143*    BMET Recent Labs     02/24/15  0517  NA  139  K  4.0  CL  89*  CO2  39*  BUN  21*  CREATININE  0.99  GLUCOSE  92    Electrolytes Recent Labs     02/24/15  0517  CALCIUM  9.2    ABG    Component Value Date/Time   PHART 7.372 02/16/2015 1205   PCO2ART 59.3* 02/16/2015 1205   PO2ART 63.3* 02/16/2015 1205   HCO3 33.6* 02/16/2015 1205   TCO2 35.4 02/16/2015 1205   O2SAT 89.9 02/16/2015 1205    Liver Enzymes Recent Labs     02/24/15  0517  AST  18  ALT  32  ALKPHOS  53  BILITOT  1.2  ALBUMIN  3.2*    Imaging No results found.   ANTIBIOTICS: 1/23 Rocephin >> 1/26 1/23 Zithromax >> 1/26 1/28 Levaquin >>  STUDIES: 1/23 CT chest >> ATX 1/24 Echo >> EF 60 to 65%, grade 1 diastolic dysfx 1/26 Transcranial doppler >> negative for shunt 1/31 PFT >> FEV1 1.59 (40%), FEV1% 83, TLC 3.78 (54%), DLCO 55%, no BD  EVENTS: 1/23 Admit 2/01 Neuro consulted  DISCUSSION: 50 yo male smoker presented with generalized weakness, swelling of legs/lips, and dyspnea associated with cough.  He was found to have acute on chronic hypoxic/hypercapnic respiratory failure.  His PFTs concerning for  mixed obstruction and restrictive process.  ASSESSMENT/PLAN:  Acute on chronic hypoxic/hypercapnic respiratory failure >> not clear this is solely from COPD and atelectasis.  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 May have mix with some obstruction. Plan: - oxygen to keep SpO2 > 90% - neuro consulted >> f/u acetylcholine receptor Ab from 2/01  AECOPD. Plan: - continue spiriva, dulera - prn albuterol - wean off prednisone as tolerated - check A1AT level as outpt  Tobacco abuse. - nicotine patch  HTN. Plan: - per primary team  Polycythemia >> initial assessment was polycythemia related to hypoxia, but has not improved with supplemental oxygen therapy. Thrombocytopenia. Plan: - defer to primary team about whether hematology assessment need   Coralyn Helling, MD [facility] Pulmonary/Critical Care 02/24/2015, 11:27 AM Pager:  [phone] After 3pm call: [phone]

## 2015-02-25 DIAGNOSIS — I10 Essential (primary) hypertension: Secondary | ICD-10-CM

## 2015-02-25 MED ORDER — CARVEDILOL 3.125 MG PO TABS: 3.1250 mg | ORAL_TABLET | Freq: Two times a day (BID) | ORAL | Status: AC

## 2015-02-25 MED ORDER — MOMETASONE FURO-FORMOTEROL FUM 100-5 MCG/ACT IN AERO: 2.0000 | INHALATION_SPRAY | Freq: Two times a day (BID) | RESPIRATORY_TRACT | Status: AC

## 2015-02-25 MED ORDER — ASPIRIN 81 MG PO TBEC: 81.0000 mg | DELAYED_RELEASE_TABLET | Freq: Every day | ORAL | Status: AC

## 2015-02-25 MED ORDER — TIOTROPIUM BROMIDE MONOHYDRATE 18 MCG IN CAPS: 18.0000 ug | ORAL_CAPSULE | Freq: Every day | RESPIRATORY_TRACT | Status: AC

## 2015-02-25 NOTE — Plan of Care (Signed)
Problem: Phase I Progression Outcomes Goal: Tolerating diet Outcome: Completed/Met Date Met:  02/25/15 Tolerating solid foods.

## 2015-02-25 NOTE — Plan of Care (Signed)
Problem: Phase II Progression Outcomes Goal: Dyspnea controlled w/progressive activity Outcome: Completed/Met Date Met:  02/25/15 Pt ambulated in hall on RA. Standby assist using front wheel walker and gait belt for safety. Pt tolerated well.

## 2015-02-25 NOTE — Discharge Summary (Signed)
Physician Discharge Summary  Bryan Gillespie ZOX:[id number] DOB: 08-08-1965 DOA: 02/14/2015  PCP: Pcp Not In System  Admit date: 02/14/2015 Discharge date: 02/25/2015  Time spent: > 35 minutes  Recommendations for Outpatient Follow-up:  1. Patient is to follow-up with pulmonologist and neurologist.  2. According to my discussion with nursing during ambulation patient's oxygen saturations fluctuated from 89-95%. 3. Monitor hgb levels. May be elevated due to chronic hypoxia  Discharge Diagnoses:  Active Problems:   Acute respiratory failure with hypoxia and hypercarbia (HCC)   Essential hypertension   Tobacco abuse   Acute respiratory failure (HCC)   CAP (community acquired pneumonia)   Hypoxemia   Chronic obstructive pulmonary disease (HCC)   COPD exacerbation (HCC)   Dilated cardiomyopathy (HCC)   Pulmonary edema   Polycythemia, secondary   Neuromuscular respiratory weakness   Restrictive lung disease   Discharge Condition: stable  Diet recommendation: Regular diet  Filed Weights   02/23/15 0500 02/24/15 0444 02/25/15 1102  Weight: 105.7 kg (233 lb 0.4 oz) 115.6 kg (254 lb 13.6 oz) 113.263 kg (249 lb 11.2 oz)    History of present illness:  49 y.o.M Hx Hypertension; Bowel obstruction; Peripheral neuropathy, and Tobacco Abuse who presented with worsening shortness of breath over 3 days associated w/ generalized weakness. Patient stated he had to be intubated 2 years ago for similar sx.   In emergency department was found to have ABG 7.290/CO2 70.7 and PO2 of 58.0. Troponin was normal - hemoglobin elevated at 18 - PCO2 of 37 - CTA of the chest did not show any evidence of PE.  Hospital Course:  SOB - Per pulmonology notes: Acute on chronic hypoxic/hypercapnic respiratory failure >> not clear this is solely from COPD and atelectasis. PFT's showed mixed obstructive/restrictive defect. Oxygenation has improved with diuresis. Plan: - oxygen to keep SpO2 > 90% - assess for home  oxygen prior to discharge - neuro consulted to assess for neuromuscular weakness contributing to restrictive defect >> f/u acetylcholine receptor Ab from 2/01  AECOPD. Plan: - continue spiriva, dulera - prn albuterol - wean off prednisone as tolerated - check A1AT level as outpt  Tobacco abuse. Plan: - nicotine patch  HTN. Plan: - per primary team  Polycythemia >> initial assessment was polycythemia related to hypoxia, but has not improved with supplemental oxygen therapy. Thrombocytopenia. Plan: - defer to primary team about whether hematology assessment need  HTN - Will d/c on carvedilol currently well controlled  Procedures:  None  Consultations:  Pulmonology   neurology  Discharge Exam: Filed Vitals:   02/25/15 0500 02/25/15 1039  BP: 100/62 120/80  Pulse: 80 88  Temp: 97.8 F (36.6 C)   Resp: 18     General: Pt in nad, alert and awake Cardiovascular: rrr, no mrg Respiratory: no increased wob, no wheezes, equal chest rise  Discharge Instructions   Discharge Instructions    Call MD for:  redness, tenderness, or signs of infection (pain, swelling, redness, odor or green/yellow discharge around incision site)    Complete by:  As directed      Call MD for:  severe uncontrolled pain    Complete by:  As directed      Call MD for:  temperature >100.4    Complete by:  As directed      Diet - low sodium heart healthy    Complete by:  As directed      Discharge instructions    Complete by:  As directed   Please follow up  with your pulmonologist and neurologist within the next 1-2 weeks. Please call their office after hospital discharge to set up appointment     Increase activity slowly    Complete by:  As directed           Current Discharge Medication List    START taking these medications   Details  aspirin EC 81 MG EC tablet Take 1 tablet (81 mg total) by mouth daily. Qty: 30 tablet, Refills: 0    carvedilol (COREG) 3.125 MG tablet Take 1  tablet (3.125 mg total) by mouth 2 (two) times daily with a meal. Qty: 60 tablet, Refills: 0    mometasone-formoterol (DULERA) 100-5 MCG/ACT AERO Inhale 2 puffs into the lungs 2 (two) times daily. Qty: 1 Inhaler, Refills: 0    tiotropium (SPIRIVA) 18 MCG inhalation capsule Place 1 capsule (18 mcg total) into inhaler and inhale daily. Qty: 30 capsule, Refills: 0      STOP taking these medications     amLODipine (NORVASC) 10 MG tablet        No Known Allergies Follow-up Information    Follow up with Rubye Oaks, NP On 03/18/2015.   Specialty:  Pulmonary Disease   Why:  Follow up with lung doctors at 9:30 AM.   Contact information:   [address] [place] [address] [phone]        The results of significant diagnostics from this hospitalization (including imaging, microbiology, ancillary and laboratory) are listed below for reference.    Significant Diagnostic Studies: Dg Chest 2 View  02/14/2015  CLINICAL DATA:  Shortness of breath and weakness for 3 days. Initial encounter. EXAM: CHEST  2 VIEW COMPARISON:  None. FINDINGS: There is no consolidative process, pneumothorax or effusion. Heart size is normal. Calcific density projecting over the right lower lung zone seen on the frontal view only and may be pleural-based. The IMPRESSION: No acute disease. Electronically Signed   By: Drusilla Kanner M.D.   On: 02/14/2015 19:02   Ct Angio Chest Pe W/cm &/or Wo Cm  02/14/2015  CLINICAL DATA:  Shortness of breath and cough for 2 weeks. History of COPD. EXAM: CT ANGIOGRAPHY CHEST WITH CONTRAST TECHNIQUE: Multidetector CT imaging of the chest was performed using the standard protocol during bolus administration of intravenous contrast. Multiplanar CT image reconstructions and MIPs were obtained to evaluate the vascular anatomy. CONTRAST:  OMNIPAQUE IOHEXOL 350 MG/ML SOLN COMPARISON:  Chest radiograph 02/14/2015 FINDINGS: Mediastinum/Lymph Nodes: No pulmonary emboli or  thoracic aortic dissection identified. No masses or pathologically enlarged lymph nodes identified. The heart is normal in size. There is mild calcified atherosclerotic disease. No evidence of pericardial effusion. Lungs/Pleura: No pulmonary mass or effusion. There are bibasilar peribronchovascular linear opacities in dependent distribution, likely representing atelectasis. No evidence of mucous plugging in the main, lobar and segmental bronchi. Upper abdomen: No acute findings. Musculoskeletal: No chest wall mass or suspicious bone lesions identified. Review of the MIP images confirms the above findings. IMPRESSION: No evidence of pulmonary embolus or thoracic dissection. Mild calcific atherosclerotic disease of the coronary arteries. Bibasilar peribronchovascular linear opacities in dependent distribution, likely representing subsegmental atelectasis. Airspace consolidation due to pneumonia although possible is felt less likely. Electronically Signed   By: Ted Mcalpine M.D.   On: 02/14/2015 20:48   Dg Chest Port 1 View  02/19/2015  CLINICAL DATA:  Pulmonary edema EXAM: PORTABLE CHEST 1 VIEW COMPARISON:  02/17/2015 FINDINGS: Low lung volumes with bibasilar opacities, atelectasis or infiltrates. Suspect small bilateral  effusions. Heart is borderline in size. No acute bony abnormality. Again noted is a mid right clavicle fracture. IMPRESSION: Continued bibasilar atelectasis or infiltrates. Small bilateral effusions. Electronically Signed   By: Charlett Nose M.D.   On: 02/19/2015 09:20   Dg Chest Port 1 View  02/17/2015  CLINICAL DATA:  Respiratory failure EXAM: PORTABLE CHEST 1 VIEW COMPARISON:  02/14/2015 FINDINGS: Lungs are markedly under aerated. Bibasilar airspace disease has increased. No pneumothorax. Cardiac silhouette is prominent likely due to low volumes. IMPRESSION: Increased bibasilar airspace disease. Electronically Signed   By: Jolaine Click M.D.   On: 02/17/2015 07:45   Dg Abd 2  Views  02/14/2015  CLINICAL DATA:  Shortness of breath and weakness with tightness and abdomen. EXAM: ABDOMEN - 2 VIEW COMPARISON:  None. FINDINGS: Upright film shows no evidence for intraperitoneal free air. There is no evidence for gaseous bowel dilation to suggest obstruction. No unexpected abdominal pelvic calcification. Curvilinear density overlying the dome of the right hemidiaphragm is presumably atelectasis in the right lower lobe. IMPRESSION: No evidence for bowel perforation or obstruction. Electronically Signed   By: Kennith Center M.D.   On: 02/14/2015 19:04    Microbiology: No results found for this or any previous visit (from the past 240 hour(s)).   Labs: Basic Metabolic Panel:  Recent Labs Lab 02/19/15 0903 02/20/15 1047 02/24/15 0517  NA 139 143 139  K 4.6 4.3 4.0  CL 93* 89* 89*  CO2 36* 40* 39*  GLUCOSE 121* 81 92  BUN 11 16 21*  CREATININE 0.88 1.01 0.99  CALCIUM 8.9 9.3 9.2  MG 2.1 2.4  --    Liver Function Tests:  Recent Labs Lab 02/19/15 0903 02/24/15 0517  AST 22 18  ALT 43 32  ALKPHOS 53 53  BILITOT 0.8 1.2  PROT 6.2* 6.3*  ALBUMIN 3.1* 3.2*   No results for input(s): LIPASE, AMYLASE in the last 168 hours. No results for input(s): AMMONIA in the last 168 hours. CBC:  Recent Labs Lab 02/19/15 0903 02/20/15 1047 02/22/15 0429 02/24/15 0517  WBC 8.6 7.4 9.4 6.5  NEUTROABS 7.7  --   --   --   HGB 18.0* 19.4* 19.8* 19.4*  HCT 57.7* 62.5* 61.8* 59.2*  MCV 102.1* 102.8* 98.6 98.0  PLT 113* 130* 139* 143*   Cardiac Enzymes: No results for input(s): CKTOTAL, CKMB, CKMBINDEX, TROPONINI in the last 168 hours. BNP: BNP (last 3 results)  Recent Labs  02/14/15 1800  BNP 28.9    ProBNP (last 3 results) No results for input(s): PROBNP in the last 8760 hours.  CBG: No results for input(s): GLUCAP in the last 168 hours.   Signed:  Penny Pia MD.  [facility] 02/25/2015, 3:02 PM

## 2015-02-25 NOTE — Progress Notes (Signed)
Pt ambulated in the hall using walker on RA. Pulse ox ranged 87-95%. Pt denied any shortness of breath. Tolerated well. Will continue to monitor. Bess Kinds, RN

## 2015-02-25 NOTE — Progress Notes (Signed)
PCCM PROGRESS NOTE  ADMISSION DATE: 02/14/2015 CONSULT DATE: 02/15/2015 REFERRING PROVIDER: [facility]  CC: Short of breath  SUBJECTIVE: Breathing better.  Denies chest pain.  Anxious to go home.  VITAL SIGNS: BP 120/80 mmHg  Pulse 88  Temp(Src) 97.8 F (36.6 C) (Oral)  Resp 18  Ht  (1.803 m)  Wt 254 lb 13.6 oz (115.6 kg)  BMI 35.56 kg/m2  SpO2 95%  INTAKE/OUTPUT: I/O last 3 completed shifts: In: 2163 [P.O.:2160; I.V.:3] Out: 2250 [Urine:2250]  General: alert HEENT: no sinus tenderness Cardiac: regular, no murmur Chest: no wheeze Abd: soft, non tender Ext: no edema Neuro: 4/5 strength Rt arm, otherwise 5/5, CN intact Skin: no rashes   CBC Recent Labs     02/24/15  0517  WBC  6.5  HGB  19.4*  HCT  59.2*  PLT  143*    BMET Recent Labs     02/24/15  0517  NA  139  K  4.0  CL  89*  CO2  39*  BUN  21*  CREATININE  0.99  GLUCOSE  92    Electrolytes Recent Labs     02/24/15  0517  CALCIUM  9.2    ABG    Component Value Date/Time   PHART 7.372 02/16/2015 1205   PCO2ART 59.3* 02/16/2015 1205   PO2ART 63.3* 02/16/2015 1205   HCO3 33.6* 02/16/2015 1205   TCO2 35.4 02/16/2015 1205   O2SAT 89.9 02/16/2015 1205    Liver Enzymes Recent Labs     02/24/15  0517  AST  18  ALT  32  ALKPHOS  53  BILITOT  1.2  ALBUMIN  3.2*    Imaging No results found.   ANTIBIOTICS: 1/23 Rocephin >> 1/26 1/23 Zithromax >> 1/26 1/28 Levaquin >>  STUDIES: 1/23 CT chest >> ATX 1/24 Echo >> EF 60 to 65%, grade 1 diastolic dysfx 1/26 Transcranial doppler >> negative for shunt 1/31 PFT >> FEV1 1.59 (40%), FEV1% 83, TLC 3.78 (54%), DLCO 55%, no BD  EVENTS: 1/23 Admit 2/01 Neuro consulted  DISCUSSION: 50 yo male smoker presented with generalized weakness, swelling of legs/lips, and dyspnea associated with cough.  He was found to have acute on chronic hypoxic/hypercapnic respiratory failure.  His PFTs concerning for mixed obstruction and restrictive  process.  ASSESSMENT/PLAN:  Acute on chronic hypoxic/hypercapnic respiratory failure >> not clear this is solely from COPD and atelectasis.  PFT's showed mixed obstructive/restrictive defect.  Oxygenation has improved with diuresis. Plan: - oxygen to keep SpO2 > 90% - assess for home oxygen prior to discharge - neuro consulted to assess for neuromuscular weakness contributing to restrictive defect >> f/u acetylcholine receptor Ab from 2/01  AECOPD. Plan: - continue spiriva, dulera - prn albuterol - wean off prednisone as tolerated - check A1AT level as outpt  Tobacco abuse. Plan: - nicotine patch  HTN. Plan: - per primary team  Polycythemia >> initial assessment was polycythemia related to hypoxia, but has not improved with supplemental oxygen therapy. Thrombocytopenia. Plan: - defer to primary team about whether hematology assessment need   I have scheduled him for pulmonary follow up with Tammy Parrett on Friday, 03/18/15 at 930 am.  Please call if additional help is needed while he is in hospital.   Coralyn Helling, MD [facility] Pulmonary/Critical Care 02/25/2015, 11:02 AM Pager:  [phone] After 3pm call: [phone]

## 2015-02-25 NOTE — Progress Notes (Signed)
Pt discharged home per MD. Discharge instructions provided. Teach back used to verify understanding of medication follow through and MD follow up. Pt escorted out via wheelchair and student nurse escort. Bess Kinds, RN

## 2015-02-27 LAB — METHYLMALONIC ACID(MMA), RND URINE
Creatinine(Crt), U: 1.53 g/L (ref 0.30–3.00)
MMA - Normalized: 1 umol/mmol cr (ref 0.4–2.5)
Methylmalonic Acid, Ur: 13 umol/L (ref 1.6–29.7)

## 2015-03-02 LAB — ACETYLCHOLINE RECEPTOR AB, ALL
ACETYLCHOLINE BINDING AB: 0.11 nmol/L (ref 0.00–0.24)
Acetylchol Block Ab: 21 % (ref 0–25)
Acetylcholine Modulat Ab: <12 % (ref 0–20)

## 2015-03-18 ENCOUNTER — Inpatient Hospital Stay: Payer: Self-pay | Admitting: Adult Health

## 2017-10-07 IMAGING — CT CT ANGIO CHEST
2 of 10 series · 17 of 46 positions shown · IV contrast (omnipaque)
Comparison: Chest radiograph 02/14/2015

CLINICAL DATA: Shortness of breath and cough for 2 weeks. History
of COPD.

EXAM:
CT ANGIOGRAPHY CHEST WITH CONTRAST
TECHNIQUE: Multidetector CT imaging of the chest was performed using the
standard protocol during bolus administration of intravenous
contrast. Multiplanar CT image reconstructions and MIPs were
obtained to evaluate the vascular anatomy.
CONTRAST:  100mL OMNIPAQUE IOHEXOL 350 MG/ML SOLN

[Series 8: thins · axial · 0.84mm/px · z∈[-258,-37]mm · 14 of 245 slices shown]
[im 12/245  lung]
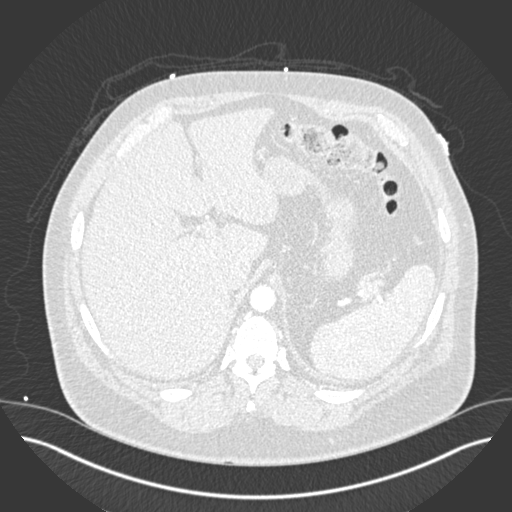
[im 35/245  soft-tissue]
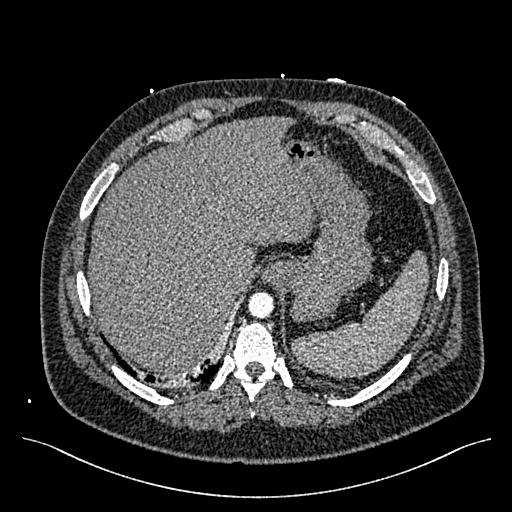
[im 47/245  lung]
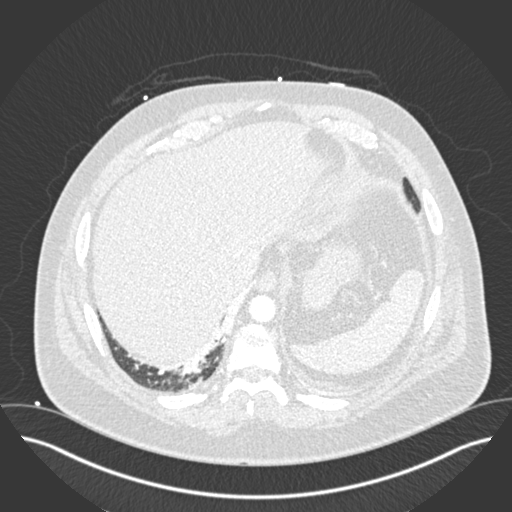
[im 70/245  soft-tissue]
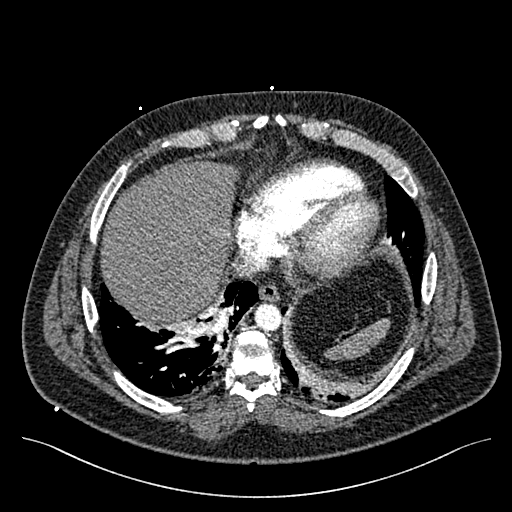
[im 82/245  lung]
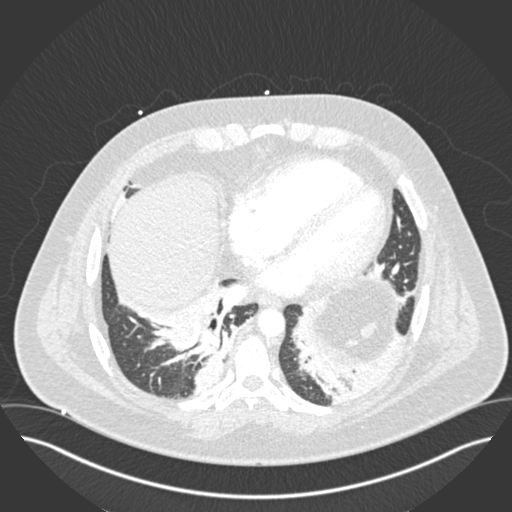
[im 93/245  soft-tissue]
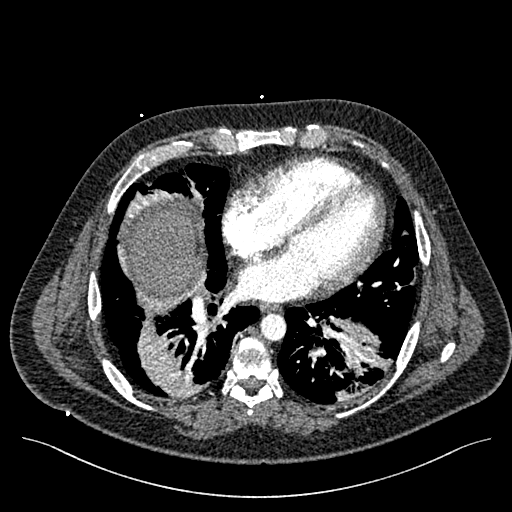
[im 117/245  lung]
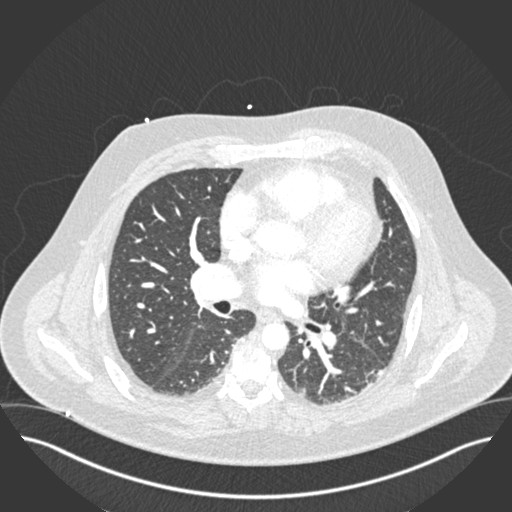
[im 128/245  soft-tissue]
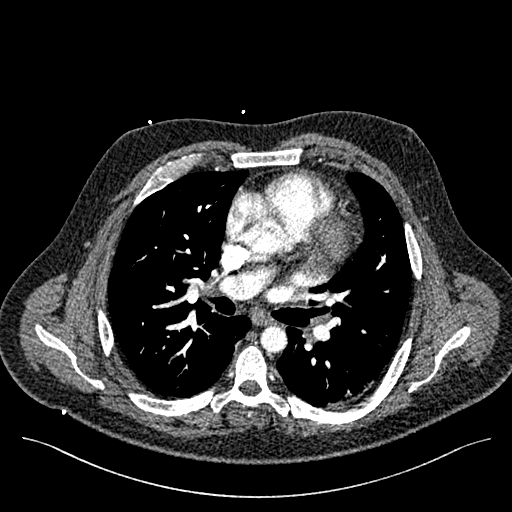
[im 152/245  lung]
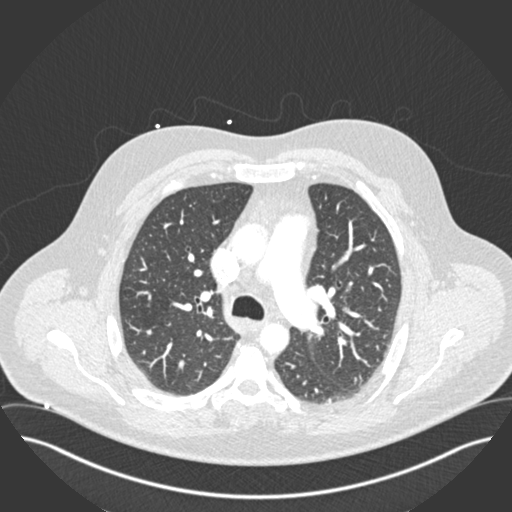
[im 163/245  soft-tissue]
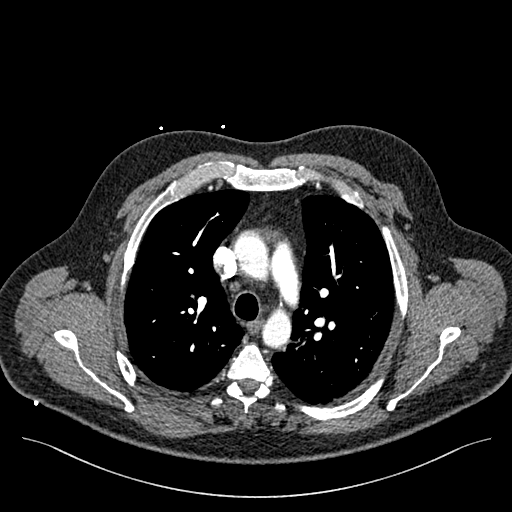
[im 186/245  lung]
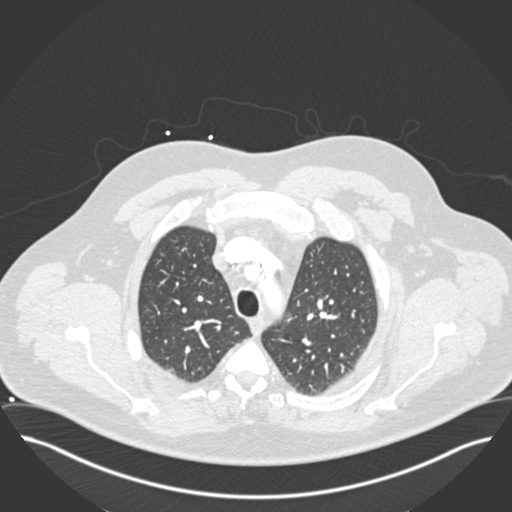
[im 198/245  soft-tissue]
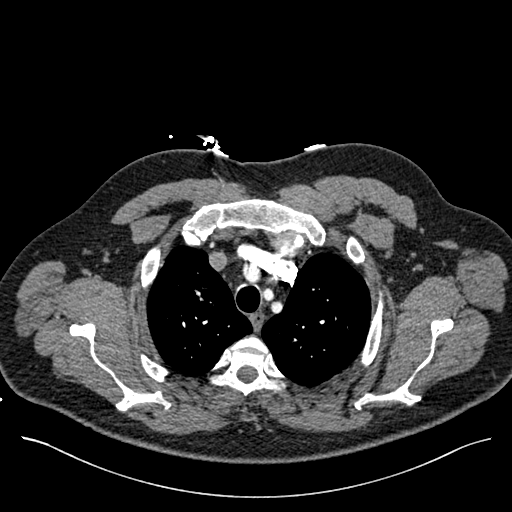
[im 210/245  lung]
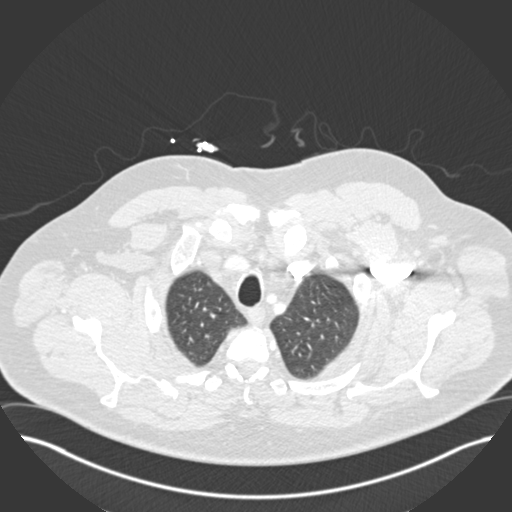
[im 233/245  soft-tissue]
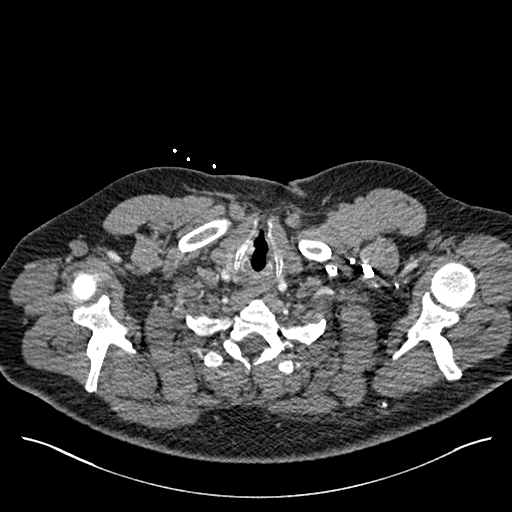

[Series 9: coronal mpr · coronal · 0.52mm/px · 3 of 145 slices shown]
[im 37/145  soft-tissue]
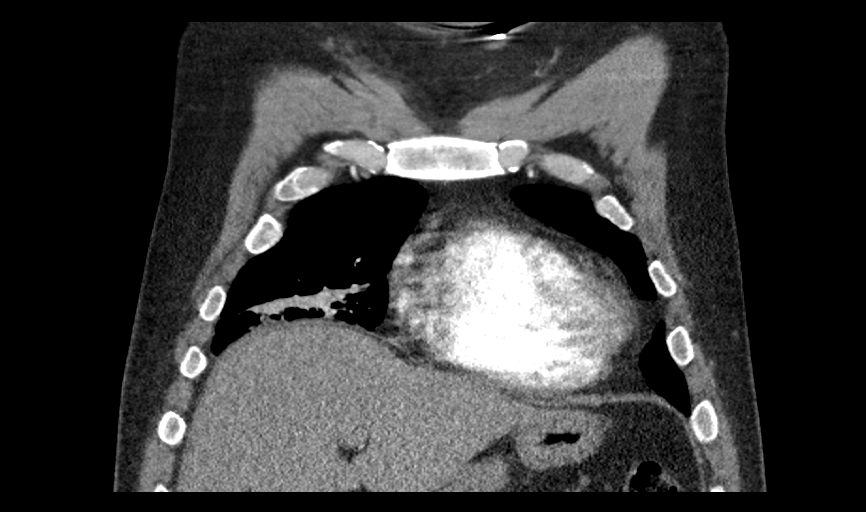
[im 73/145  soft-tissue]
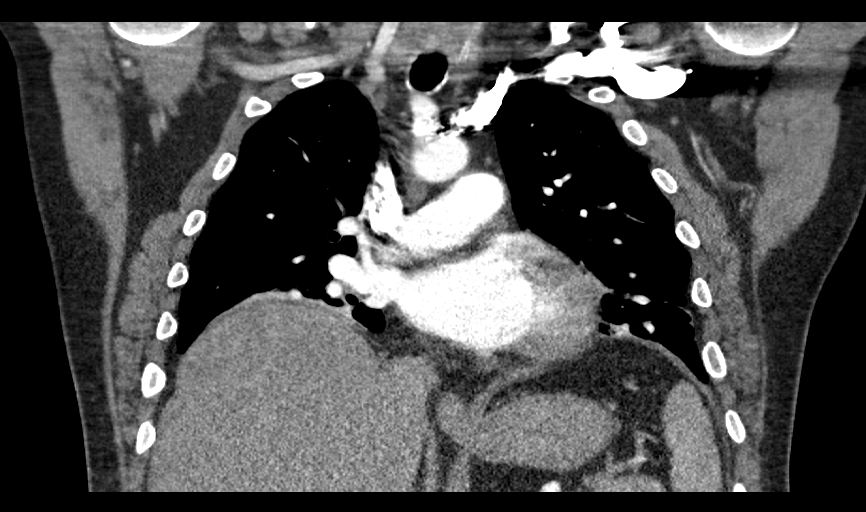
[im 109/145  soft-tissue]
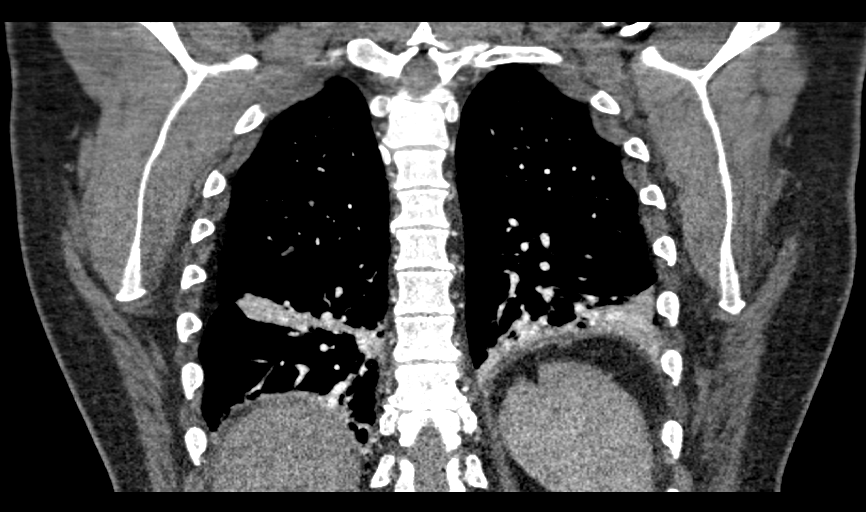

[17 of 46 positions shown; findings below may reference images not displayed]

FINDINGS: Mediastinum/Lymph Nodes: No pulmonary emboli or thoracic aortic
dissection identified. No masses or pathologically enlarged lymph
nodes identified. The heart is normal in size. There is mild
calcified atherosclerotic disease. No evidence of pericardial
effusion.

Lungs/Pleura: No pulmonary mass or effusion. There are bibasilar
peribronchovascular linear opacities in dependent distribution,
likely representing atelectasis. No evidence of mucous plugging in
the main, lobar and segmental bronchi.

Upper abdomen: No acute findings.

Musculoskeletal: No chest wall mass or suspicious bone lesions
identified.

Review of the MIP images confirms the above findings.
IMPRESSION: No evidence of pulmonary embolus or thoracic dissection.

Mild calcific atherosclerotic disease of the coronary arteries.

Bibasilar peribronchovascular linear opacities in dependent
distribution, likely representing subsegmental atelectasis. Airspace
consolidation due to pneumonia although possible is felt less
likely.

## 2017-10-10 IMAGING — CR DG CHEST 1V PORT
1 series · 1 of 1 positions shown · non-contrast
Comparison: 02/14/2015

CLINICAL DATA: Respiratory failure

EXAM:
PORTABLE CHEST 1 VIEW

[AP]
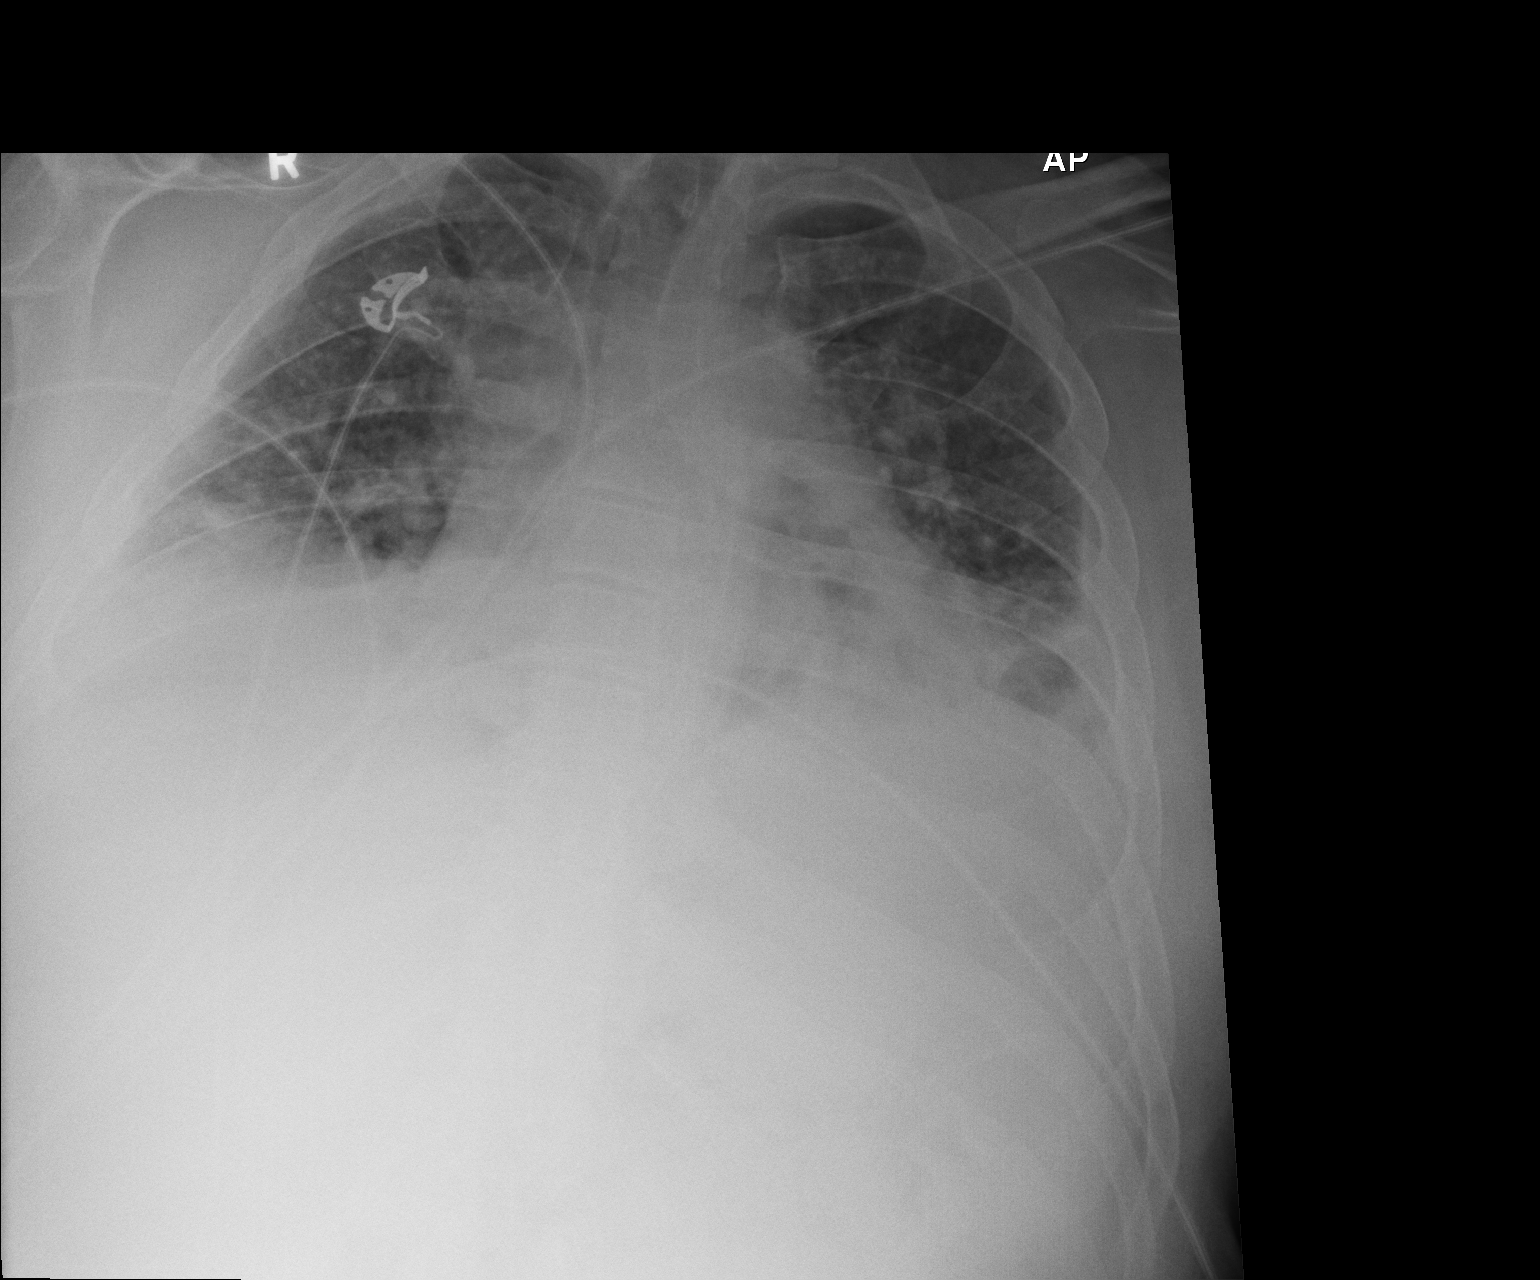

[1 of 1 positions shown; findings below may reference images not displayed]

FINDINGS: Lungs are markedly under aerated. Bibasilar airspace disease has
increased. No pneumothorax. Cardiac silhouette is prominent likely
due to low volumes.
IMPRESSION: Increased bibasilar airspace disease.
